# Patient Record
Sex: Female | Born: 1980 | Race: Black or African American | Hispanic: No | Marital: Single | Smoking: Never smoker
Health system: Southern US, Community
[De-identification: ages and names within clinical notes are randomized; demographics above are authoritative.]

## PROBLEM LIST (undated history)

## (undated) DIAGNOSIS — Z789 Other specified health status: Secondary | ICD-10-CM

## (undated) HISTORY — PX: NO PAST SURGERIES: SHX2092

## (undated) HISTORY — PX: WISDOM TOOTH EXTRACTION: SHX21

## (undated) HISTORY — PX: DILATION AND CURETTAGE OF UTERUS: SHX78

---
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
## 2003-02-06 ENCOUNTER — Inpatient Hospital Stay (HOSPITAL_COMMUNITY): Admission: RE | Admit: 2003-02-06 | Discharge: 2003-02-06 | Payer: Self-pay | Admitting: *Deleted

## 2003-02-06 ENCOUNTER — Encounter: Payer: Self-pay | Admitting: *Deleted

## 2003-02-07 ENCOUNTER — Ambulatory Visit (HOSPITAL_COMMUNITY): Admission: RE | Admit: 2003-02-07 | Discharge: 2003-02-07 | Payer: Self-pay | Admitting: *Deleted

## 2003-02-07 ENCOUNTER — Encounter (INDEPENDENT_AMBULATORY_CARE_PROVIDER_SITE_OTHER): Payer: Self-pay | Admitting: Specialist

## 2003-03-07 ENCOUNTER — Encounter: Admission: RE | Admit: 2003-03-07 | Discharge: 2003-03-07 | Payer: Self-pay | Admitting: Obstetrics and Gynecology

## 2003-09-17 ENCOUNTER — Emergency Department (HOSPITAL_COMMUNITY): Admission: AD | Admit: 2003-09-17 | Discharge: 2003-09-17 | Payer: Self-pay | Admitting: Internal Medicine

## 2005-09-17 ENCOUNTER — Ambulatory Visit (HOSPITAL_COMMUNITY): Admission: RE | Admit: 2005-09-17 | Discharge: 2005-09-17 | Payer: Self-pay | Admitting: *Deleted

## 2005-10-16 ENCOUNTER — Ambulatory Visit: Payer: Self-pay | Admitting: Certified Nurse Midwife

## 2005-10-16 ENCOUNTER — Inpatient Hospital Stay (HOSPITAL_COMMUNITY): Admission: AD | Admit: 2005-10-16 | Discharge: 2005-10-17 | Payer: Self-pay | Admitting: Obstetrics & Gynecology

## 2006-01-01 ENCOUNTER — Ambulatory Visit: Payer: Self-pay | Admitting: Obstetrics and Gynecology

## 2006-01-01 ENCOUNTER — Inpatient Hospital Stay (HOSPITAL_COMMUNITY): Admission: AD | Admit: 2006-01-01 | Discharge: 2006-01-01 | Payer: Self-pay | Admitting: Family Medicine

## 2006-01-05 ENCOUNTER — Ambulatory Visit: Payer: Self-pay | Admitting: Obstetrics & Gynecology

## 2006-01-09 ENCOUNTER — Inpatient Hospital Stay (HOSPITAL_COMMUNITY): Admission: AD | Admit: 2006-01-09 | Discharge: 2006-01-12 | Payer: Self-pay | Admitting: Obstetrics & Gynecology

## 2006-01-09 ENCOUNTER — Ambulatory Visit: Payer: Self-pay | Admitting: Family Medicine

## 2006-07-31 ENCOUNTER — Emergency Department (HOSPITAL_COMMUNITY): Admission: EM | Admit: 2006-07-31 | Discharge: 2006-07-31 | Payer: Self-pay | Admitting: Emergency Medicine

## 2007-06-06 ENCOUNTER — Emergency Department (HOSPITAL_COMMUNITY): Admission: EM | Admit: 2007-06-06 | Discharge: 2007-06-06 | Payer: Self-pay | Admitting: Emergency Medicine

## 2007-06-26 ENCOUNTER — Emergency Department (HOSPITAL_COMMUNITY): Admission: EM | Admit: 2007-06-26 | Discharge: 2007-06-26 | Payer: Self-pay | Admitting: Emergency Medicine

## 2010-12-20 ENCOUNTER — Emergency Department (HOSPITAL_COMMUNITY)
Admission: EM | Admit: 2010-12-20 | Discharge: 2010-12-20 | Disposition: A | Discharge status: Home / Self Care | Payer: Self-pay | Attending: Emergency Medicine | Admitting: Emergency Medicine

## 2010-12-20 DIAGNOSIS — R000 Tachycardia, unspecified: Secondary | ICD-10-CM | POA: Insufficient documentation

## 2010-12-20 DIAGNOSIS — R002 Palpitations: Secondary | ICD-10-CM | POA: Insufficient documentation

## 2011-01-03 NOTE — Discharge Summary (Signed)
NAME:  Lori Cross, Lori Cross               ACCOUNT NO.:  [id number]   MEDICAL RECORD NO.:  [id number]          PATIENT TYPE:  INP   LOCATION:  9116                          FACILITY:  [facility]   PHYSICIAN:  Ginger Carne, MD  DATE OF BIRTH:  1980/09/13   DATE OF ADMISSION:  01/09/2006  DATE OF DISCHARGE:                                 DISCHARGE SUMMARY   REASON FOR HOSPITALIZATION:  Term pregnancy in active labor.   IN-HOSPITAL PROCEDURES:  1.  Normal spontaneous vaginal delivery.  2.  Antibiotic treatment for postpartum pyelonephritis.   FINAL DIAGNOSES:  1.  Normal spontaneous vaginal delivery, term viable delivery of female      infant.  2.  Postpartum pyelonephritis   HOSPITAL COURSE:  This patient is a 30 year old gravida 3, para 2-0-1-2,  African-American female presenting at 40-5/[redacted] weeks gestation in active labor  who delivered a female infant by normal spontaneous vaginal delivery on Jan 09, 2006.  Her postpartum course was complicated by a temperature elevation  in the evening of delivery to 100.6 degrees Fahrenheit which then increased  to 103.1 degrees Fahrenheit.  The patient had been previously treated with  Macrobid for a presumptive diagnosis of urinary tract infection.  This was  approximately three to four weeks ago, and cultures demonstrated sensitivity  to all antibiotics.  The patient has had no specific symptoms suggestive of  any one particular organ site as the etiology for infection.  Specifically,  the uterus was not tender, scant flow.  She did not have CVA tenderness.  No  suggestion of upper respiratory tract infection.  Lungs were clear, and  calves were without tenderness.  Perineum site was clean and dry, and the  patient did not have a laceration.  The patient was empirically treated with  Unasyn 3.0 grams IV every 6 hours for a presumptive diagnosis of  pyelonephritis/ urinary tract infection.  The patient's temperature in the  past 24 hours prior to  discharge remained normal.   The patient is O+ and rubella immune.   The patient was thus discharged with Augmentin 875 mg twice a day in  addition to prenatal vitamins and Ortho Tri-Cyclen Lo to be taken daily.  She will return to the health department in four weeks for her postpartum  care.  The patient was advised to contact the office if her temperature is  above 100.4 degrees Fahrenheit - should check the temperature twice a day,  increasing vaginal bleeding, pelvic pain, uterine pain and/or flank pain.  All questions answered to satisfaction.      Ginger Carne, MD  Electronically Signed     SHB/MEDQ  D:  01/12/2006  T:  01/12/2006  Job:  [id number]

## 2011-01-03 NOTE — Op Note (Signed)
   NAME:  Lori Cross, FIKE                         ACCOUNT NO.:  [id number]   MEDICAL RECORD NO.:  [id number]                   PATIENT TYPE:  AMB   LOCATION:  SDC                                  [facility]ACILITY:  [facility]   PHYSICIAN:  Clement Husbands, M.D.         DATE OF BIRTH:  1981-04-10   DATE OF PROCEDURE:  02/07/2003  DATE OF DISCHARGE:                                 OPERATIVE REPORT   PREOPERATIVE DIAGNOSIS:  Missed abortion.   POSTOPERATIVE DIAGNOSIS:  Missed abortion.   OPERATION:  Suction dilatation and curettage.   SURGEON:  Burnadette Peter, M.D.   ANESTHESIA:  General anesthesia.   DESCRIPTION OF PROCEDURE:  With the patient under satisfactory general  anesthesia in the dorsal lithotomy position, the perineum and vagina were  prepped and draped.  Anterior weighted speculum was placed in the vagina.  The uterus was about 10 weeks' size.  The anterior lip of the cervix was  grasped with single-tooth tenaculum.  The cervical canal was dilated  sufficiently to accept a #8 and a #9 curved suction curette.  A #8 suction  curette was inserted and some placental white tissue was removed.  Sharp  curettage was then done which revealed that the entire anterior uterine fall  felt very smooth and not the typical gritty feeling.  Expiration with  polyp forceps was then done and some larger pieces of tissue were removed  plus some clear fluid came forth.  Using the #9 curved suction curetted, the  endometrial cavity was then recuretted yielding a fair amount of obvious  tissues of pregnancy.  Intravenous Pitocin drip was running.  The uterus was  firming up really well.  After this, sharp curettage was again done and the  entire uterine cavity was felt to be normal.  Blood loss was minimal.  Sponge count correct.  The patient tolerated the procedure well and returned  to the recovery room in satisfactory condition.                                               Clement Husbands, M.D.    EFR/MEDQ  D:  02/07/2003  T:  02/08/2003  Job:  [id number]

## 2011-01-03 NOTE — H&P (Signed)
   NAME:  Lori Cross, Lori Cross                         ACCOUNT NO.:  [id number]   MEDICAL RECORD NO.:  [id number]                   PATIENT TYPE:  MAT   LOCATION:  MATC                                 [facility]ILITY:  [facility]   PHYSICIAN:  Clement Husbands, M.D.         DATE OF BIRTH:  14-Oct-1980   DATE OF ADMISSION:  02/06/2003  DATE OF DISCHARGE:                                HISTORY & PHYSICAL   HISTORY OF PRESENT ILLNESS:  This 30 year old black female gravida 2, para  1, last menstrual period November 10, 2002, has been followed at [facility]. Today, her 2nd visit, there were no fetal heart tones noted. She was  referred over here to [facility]  for an ultrasound. The ultrasound  failed to reveal a yolk sac or fetal heart rate.   PAST MEDICAL HISTORY:  No operations. She was treated with 2 weeks ago for  Chlamydia.   ALLERGIES:  None.   MEDICATIONS:  Prenatal vitamins.   LABORATORY DATA:  Blood type O positive, negative antibodies. Serology  nonreactive. Hepatitis B negative. Rubella immune. She has sickle cell  trait. Gonorrhea negative, Chlamydia as mentioned above was positive. HIV  antibody negative. Pap smear negative.   SOCIAL HISTORY:  Noncontributory.   PHYSICAL EXAMINATION:  VITAL SIGNS:  Blood pressure normal.  CHEST:  Clear to auscultation and percussion.  HEART:  Regular sinus rhythm without murmurs or enlargement.  BREASTS:  Normal.  ABDOMEN:  Soft, nontender. The uterus was not palpable.  PELVIS:  The external genitalia and BUS glands were normal. The vaginal  vault was able to be visualized as was the cervix. The uterus is thought to  be about 8 to 10 weeks size, firm and somewhat posterior. Adnexal structures  were normal.   IMPRESSION:  Missed abortion.   PLAN:  Suction dilatation and curretage tomorrow.                                                 Clement Husbands, M.D.    EFR/MEDQ  D:  02/06/2003  T:  02/06/2003  Job:  [id number]

## 2011-02-16 ENCOUNTER — Emergency Department (HOSPITAL_COMMUNITY)
Admission: EM | Admit: 2011-02-16 | Discharge: 2011-02-16 | Disposition: A | Discharge status: Home / Self Care | Payer: Medicaid Other | Attending: Emergency Medicine | Admitting: Emergency Medicine

## 2011-02-16 DIAGNOSIS — R1310 Dysphagia, unspecified: Secondary | ICD-10-CM | POA: Insufficient documentation

## 2011-02-16 DIAGNOSIS — J029 Acute pharyngitis, unspecified: Secondary | ICD-10-CM | POA: Insufficient documentation

## 2011-02-16 DIAGNOSIS — R509 Fever, unspecified: Secondary | ICD-10-CM | POA: Insufficient documentation

## 2011-02-16 LAB — RAPID STREP SCREEN (MED CTR MEBANE ONLY): Streptococcus, Group A Screen (Direct): NEGATIVE

## 2011-07-20 ENCOUNTER — Emergency Department (HOSPITAL_COMMUNITY)
Admission: EM | Admit: 2011-07-20 | Discharge: 2011-07-20 | Disposition: A | Discharge status: Home / Self Care | Payer: Medicaid Other | Attending: Emergency Medicine | Admitting: Emergency Medicine

## 2011-07-20 ENCOUNTER — Encounter: Payer: Self-pay | Admitting: Emergency Medicine

## 2011-07-20 DIAGNOSIS — H5789 Other specified disorders of eye and adnexa: Secondary | ICD-10-CM | POA: Insufficient documentation

## 2011-07-20 DIAGNOSIS — H5710 Ocular pain, unspecified eye: Secondary | ICD-10-CM | POA: Insufficient documentation

## 2011-07-20 DIAGNOSIS — H109 Unspecified conjunctivitis: Secondary | ICD-10-CM

## 2011-07-20 DIAGNOSIS — H11419 Vascular abnormalities of conjunctiva, unspecified eye: Secondary | ICD-10-CM | POA: Insufficient documentation

## 2011-07-20 MED ORDER — ERYTHROMYCIN 5 MG/GM OP OINT
1.0000 "application " | TOPICAL_OINTMENT | Freq: Once | OPHTHALMIC | Status: AC
  Administered 2011-07-20: 1 via OPHTHALMIC
  Filled 2011-07-20: qty 1

## 2011-07-20 MED ORDER — FLUORESCEIN SODIUM 1 MG OP STRP
1.0000 | ORAL_STRIP | Freq: Once | OPHTHALMIC | Status: AC
  Administered 2011-07-20: 1 via OPHTHALMIC
  Filled 2011-07-20 (×2): qty 1

## 2011-07-20 MED ORDER — ERYTHROMYCIN 5 MG/GM OP OINT: TOPICAL_OINTMENT | OPHTHALMIC | Status: AC

## 2011-07-20 MED ORDER — TETRACAINE HCL 0.5 % OP SOLN
2.0000 [drp] | Freq: Once | OPHTHALMIC | Status: AC
  Administered 2011-07-20: 2 [drp] via OPHTHALMIC
  Filled 2011-07-20 (×2): qty 2

## 2011-07-20 NOTE — ED Provider Notes (Signed)
History     CSN: [id number] Arrival date & time: 07/20/2011  5:21 PM   First MD Initiated Contact with Patient 07/20/11 1918      Chief Complaint  Patient presents with  . Eye Problem    (Consider location/radiation/quality/duration/timing/severity/associated sxs/prior treatment) Patient is a 30 y.o. female presenting with eye problem. The history is provided by the patient.  Eye Problem  This is a new problem. The current episode started more than 1 week ago. The problem occurs constantly. The problem has not changed since onset.There is pain in the left eye. There was no injury mechanism. The patient is experiencing no pain. There is no history of trauma to the eye. There is no known exposure to pink eye. She does not wear contacts. Associated symptoms include discharge (mild, overnight), foreign body sensation and eye redness. Pertinent negatives include no blurred vision, no decreased vision, no photophobia, no nausea, no vomiting, no tingling, no weakness and no itching. She has tried water for the symptoms. The treatment provided no relief.    History reviewed. No pertinent past medical history.  History reviewed. No pertinent past surgical history.  No family history on file.  History  Substance Use Topics  . Smoking status: Never Smoker   . Smokeless tobacco: Not on file  . Alcohol Use: Yes    OB History    Grav Para Term Preterm Abortions TAB SAB Ect Mult Living                  Review of Systems  Constitutional: Negative for fever and chills.  HENT: Negative for ear pain, sore throat, facial swelling and tinnitus.   Eyes: Positive for discharge (mild, overnight) and redness. Negative for blurred vision, photophobia, pain and visual disturbance.  Respiratory: Negative for cough and shortness of breath.   Cardiovascular: Negative for chest pain and palpitations.  Gastrointestinal: Negative for nausea, vomiting and abdominal pain.  Musculoskeletal: Negative for  back pain.  Skin: Negative for color change, itching and rash.  Neurological: Negative for tingling, weakness, light-headedness and headaches.  All other systems reviewed and are negative.    Allergies  Review of patient's allergies indicates no known allergies.  Home Medications  No current outpatient prescriptions on file.  BP 128/77  Pulse 98  Temp(Src) 98.1 F (36.7 C) (Oral)  Resp 20  SpO2 99%  Physical Exam  Nursing note and vitals reviewed. Constitutional: She is oriented to person, place, and time. She appears well-developed and well-nourished.  HENT:  Head: Normocephalic and atraumatic.  Eyes: Pupils are equal, round, and reactive to light. No foreign bodies found. Left eye exhibits discharge (mild, yellow). Left eye exhibits no exudate. No foreign body present in the left eye. Left conjunctiva is injected. Left eye exhibits normal extraocular motion.  Slit lamp exam:      The left eye shows no corneal abrasion, no corneal ulcer, no foreign body, no hyphema and no fluorescein uptake.  Cardiovascular: Normal rate, regular rhythm, normal heart sounds and intact distal pulses.   Pulmonary/Chest: Effort normal and breath sounds normal. No respiratory distress.  Abdominal: Soft. She exhibits no distension. There is no tenderness.  Neurological: She is alert and oriented to person, place, and time.  Skin: Skin is warm and dry.  Psychiatric: She has a normal mood and affect.    ED Course  Procedures (including critical care time)  Labs Reviewed - No data to display No results found.   1. Conjunctivitis  MDM  30 year old female presents with a week's worth of foreign body sensation in her left eye, and onset of redness as of today. States initially had some redness of the eye with onset of the foreign body sensation however has resolved over the course of the past week. Denies any photophobia, changes in vision, pain or irritation, no facial pain or swelling, no  fevers or other signs of infection. Does have occasional small amount of discharge overnight. Has tried rinsing her eye with water without any success. He does not wear contacts and denies any trauma to the left side. No signs of any facial swelling, erythema, or tenderness to palpation. Eye exam is remarkable for mild injection of the left conjunctiva with a small amount of yellow discharge, pupils are equal and round, reactive to light, no foreign bodies are seen, there is no sign of corneal abrasion or abnormal fluorescein uptake. Discussed with patient that current symptoms are likely due to infectious etiology, there was possibly an initial underlying foreign body present causing irritation leading to recent symptoms, however no foreign bodies are currently seen. Discussed with the patient's treatment, as well as followup if symptoms persist, and changing of symptoms necessitating return to the ED. Patient expresses understanding with this plan.        Theotis Burrow, MD 07/20/11 [phone]

## 2011-07-20 NOTE — ED Notes (Signed)
Resident at bedside.  

## 2011-07-20 NOTE — ED Notes (Signed)
Patient complaining of left eye irritation for about a week.  Patient states that it began with a feeling of something being in her eye, and has progressively gotten worse over the course of the week.  Patient states that the redness in her left eye started a few days ago and she felt that she needed to have it checked out.  Patient reports itchiness, redness, and irritation in the left eye.  Patient alert and oriented x4; PERRL present.  Family present at bedside.  Will continue to monitor.

## 2011-07-20 NOTE — ED Notes (Signed)
Patient given copy of discharge paperwork.  Went over discharge instructions with patient; instructed patient to apply eye ointment three times a day for 1 week and to follow up with an eye physician if symptoms persist more than three days.  Patient instructed to return to the ED for new, worsening, or concerning symptoms.

## 2011-07-20 NOTE — ED Notes (Signed)
L eye irritation x 1 week. States it just started turning red.  Denies pain.

## 2011-07-22 NOTE — ED Provider Notes (Signed)
I saw and evaluated the patient, reviewed the resident's note and I agree with the findings and plan.   Les Longmore A. Patrica Duel, MD 07/22/11 1042

## 2011-11-26 ENCOUNTER — Encounter (HOSPITAL_COMMUNITY): Payer: Self-pay | Admitting: *Deleted

## 2011-11-26 ENCOUNTER — Emergency Department (HOSPITAL_COMMUNITY)
Admission: EM | Admit: 2011-11-26 | Discharge: 2011-11-26 | Disposition: A | Discharge status: Home / Self Care | Payer: Medicaid Other | Attending: Emergency Medicine | Admitting: Emergency Medicine

## 2011-11-26 DIAGNOSIS — H9209 Otalgia, unspecified ear: Secondary | ICD-10-CM | POA: Insufficient documentation

## 2011-11-26 DIAGNOSIS — H9201 Otalgia, right ear: Secondary | ICD-10-CM

## 2011-11-26 MED ORDER — ANTIPYRINE-BENZOCAINE 5.4-1.4 % OT SOLN
3.0000 [drp] | Freq: Once | OTIC | Status: AC
  Administered 2011-11-26: 4 [drp] via OTIC
  Filled 2011-11-26: qty 10

## 2011-11-26 MED ORDER — CETIRIZINE-PSEUDOEPHEDRINE ER 5-120 MG PO TB12: 1.0000 | ORAL_TABLET | Freq: Every day | ORAL | Status: DC

## 2011-11-26 MED ORDER — IBUPROFEN 600 MG PO TABS: 600.0000 mg | ORAL_TABLET | Freq: Four times a day (QID) | ORAL | Status: AC | PRN

## 2011-11-26 NOTE — ED Notes (Signed)
Reports having right ear pain x 2 days.

## 2011-11-26 NOTE — Discharge Instructions (Signed)
Your ear does not appear to be infected. Apply auralgan ear drops, 2-4 drops every 2-4 hrs. Take ibuprofen for pain. Take zyrtec D for congestion as prescribed. Follow up with your primary care doctor if not improving.  Otalgia The most common reason for this in children is an infection of the middle ear. Pain from the middle ear is usually caused by a build-up of fluid and pressure behind the eardrum. Pain from an earache can be sharp, dull, or burning. The pain may be temporary or constant. The middle ear is connected to the nasal passages by a short narrow tube called the Eustachian tube. The Eustachian tube allows fluid to drain out of the middle ear, and helps keep the pressure in your ear equalized. CAUSES  A cold or allergy can block the Eustachian tube with inflammation and the build-up of secretions. This is especially likely in small children, because their Eustachian tube is shorter and more horizontal. When the Eustachian tube closes, the normal flow of fluid from the middle ear is stopped. Fluid can accumulate and cause stuffiness, pain, hearing loss, and an ear infection if germs start growing in this area. SYMPTOMS  The symptoms of an ear infection may include fever, ear pain, fussiness, increased crying, and irritability. Many children will have temporary and minor hearing loss during and right after an ear infection. Permanent hearing loss is rare, but the risk increases the more infections a child has. Other causes of ear pain include retained water in the outer ear canal from swimming and bathing. Ear pain in adults is less likely to be from an ear infection. Ear pain may be referred from other locations. Referred pain may be from the joint between your jaw and the skull. It may also come from a tooth problem or problems in the neck. Other causes of ear pain include:  A foreign body in the ear.   Outer ear infection.   Sinus infections.   Impacted ear wax.   Ear injury.    Arthritis of the jaw or TMJ problems.   Middle ear infection.   Tooth infections.   Sore throat with pain to the ears.  DIAGNOSIS  Your caregiver can usually make the diagnosis by examining you. Sometimes other special studies, including x-rays and lab work may be necessary. TREATMENT   If antibiotics were prescribed, use them as directed and finish them even if you or your child's symptoms seem to be improved.   Sometimes PE tubes are needed in children. These are little plastic tubes which are put into the eardrum during a simple surgical procedure. They allow fluid to drain easier and allow the pressure in the middle ear to equalize. This helps relieve the ear pain caused by pressure changes.  HOME CARE INSTRUCTIONS   Only take over-the-counter or prescription medicines for pain, discomfort, or fever as directed by your caregiver. DO NOT GIVE CHILDREN ASPIRIN because of the association of Reye's Syndrome in children taking aspirin.   Use a cold pack applied to the outer ear for 15 to 20 minutes, 3 to 4 times per day or as needed may reduce pain. Do not apply ice directly to the skin. You may cause frost bite.   Over-the-counter ear drops used as directed may be effective. Your caregiver may sometimes prescribe ear drops.   Resting in an upright position may help reduce pressure in the middle ear and relieve pain.   Ear pain caused by rapidly descending from high altitudes can be  relieved by swallowing or chewing gum. Allowing infants to suck on a bottle during airplane travel can help.   Do not smoke in the house or near children. If you are unable to quit smoking, smoke outside.   Control allergies.  SEEK IMMEDIATE MEDICAL CARE IF:   You or your child are becoming sicker.   Pain or fever relief is not obtained with medicine.   You or your child's symptoms (pain, fever, or irritability) do not improve within 24 to 48 hours or as instructed.   Severe pain suddenly stops  hurting. This may indicate a ruptured eardrum.   You or your children develop new problems such as severe headaches, stiff neck, difficulty swallowing, or swelling of the face or around the ear.  Document Released: 03/21/2004 Document Revised: 07/24/2011 Document Reviewed: 07/26/2008 [facility] Patient Information 2012 [place], [place].

## 2011-11-26 NOTE — ED Provider Notes (Signed)
History     CSN: [id number]  Arrival date & time 11/26/11  4098   First MD Initiated Contact with Patient 11/26/11 [phone]      Chief Complaint  Patient presents with  . Otalgia    (Consider location/radiation/quality/duration/timing/severity/associated sxs/prior treatment) Patient is a 31 y.o. female presenting with ear pain. The history is provided by the patient.  Otalgia This is a new problem. The current episode started yesterday. There is pain in the right ear. The problem occurs constantly. The problem has not changed since onset.There has been no fever. Associated symptoms include rhinorrhea. Pertinent negatives include no ear discharge, no headaches, no hearing loss, no sore throat and no rash.  States started having right ear pain yesterday. Reports pain is constant, not worsened with palpation or swallowing. Admits to nasal congestion. Denies sore throat. Did not take any medications. Denies hearing changes, no headache, no drainage.  History reviewed. No pertinent past medical history.  History reviewed. No pertinent past surgical history.  History reviewed. No pertinent family history.  History  Substance Use Topics  . Smoking status: Never Smoker   . Smokeless tobacco: Not on file  . Alcohol Use: Yes    OB History    Grav Para Term Preterm Abortions TAB SAB Ect Mult Living                  Review of Systems  Constitutional: Negative for fever and chills.  HENT: Positive for ear pain and rhinorrhea. Negative for hearing loss, sore throat and ear discharge.   Eyes: Negative.   Respiratory: Negative.   Cardiovascular: Negative.   Gastrointestinal: Negative.   Skin: Negative.  Negative for rash.  Neurological: Negative for dizziness, weakness and headaches.  Psychiatric/Behavioral: Negative.     Allergies  Review of patient's allergies indicates no known allergies.  Home Medications   Current Outpatient Rx  Name Route Sig Dispense Refill  . SODIUM  CHLORIDE 0.65 % NA SOLN Nasal Place 1 spray into the nose as needed. For stuffy nose      BP 131/77  Pulse 92  Temp(Src) 99.1 F (37.3 C) (Oral)  Resp 18  SpO2 100%  Physical Exam  Nursing note and vitals reviewed. Constitutional: She appears well-developed and well-nourished. No distress.  HENT:  Head: Normocephalic and atraumatic.  Right Ear: Tympanic membrane, external ear and ear canal normal. No drainage, swelling or tenderness. No decreased hearing is noted.  Left Ear: Tympanic membrane, external ear and ear canal normal. No drainage, swelling or tenderness. No decreased hearing is noted.  Nose: Rhinorrhea present.  Mouth/Throat: Uvula is midline, oropharynx is clear and moist and mucous membranes are normal. No dental abscesses or uvula swelling. No oropharyngeal exudate, posterior oropharyngeal edema or posterior oropharyngeal erythema.  Eyes: Conjunctivae are normal.  Neck: Neck supple.  Cardiovascular: Normal rate, regular rhythm and normal heart sounds.   Pulmonary/Chest: Effort normal and breath sounds normal. No respiratory distress.  Neurological: She is alert.  Skin: Skin is warm and dry.  Psychiatric: She has a normal mood and affect.    ED Course  Procedures (including critical care time)  No signs of infection on exam. No bad teeth, throat is normal. Pt in NAD, vitals normal. Applied auralgan ear drops, which helped her. Will d/c home.    1. Right ear pain       MDM          Lottie Mussel, PA 11/26/11 1542

## 2011-11-27 NOTE — ED Provider Notes (Signed)
Medical screening examination/treatment/procedure(s) were performed by non-physician practitioner and as supervising physician I was immediately available for consultation/collaboration.  Tyreece Gelles, MD 11/27/11 1327 

## 2011-12-14 ENCOUNTER — Emergency Department (HOSPITAL_COMMUNITY)
Admission: EM | Admit: 2011-12-14 | Discharge: 2011-12-14 | Disposition: A | Discharge status: Home / Self Care | Payer: Medicaid Other | Attending: Emergency Medicine | Admitting: Emergency Medicine

## 2011-12-14 ENCOUNTER — Encounter (HOSPITAL_COMMUNITY): Payer: Self-pay | Admitting: Emergency Medicine

## 2011-12-14 DIAGNOSIS — R6883 Chills (without fever): Secondary | ICD-10-CM | POA: Insufficient documentation

## 2011-12-14 DIAGNOSIS — B9789 Other viral agents as the cause of diseases classified elsewhere: Secondary | ICD-10-CM | POA: Insufficient documentation

## 2011-12-14 DIAGNOSIS — M791 Myalgia, unspecified site: Secondary | ICD-10-CM

## 2011-12-14 DIAGNOSIS — B349 Viral infection, unspecified: Secondary | ICD-10-CM

## 2011-12-14 DIAGNOSIS — IMO0001 Reserved for inherently not codable concepts without codable children: Secondary | ICD-10-CM | POA: Insufficient documentation

## 2011-12-14 LAB — URINE MICROSCOPIC-ADD ON

## 2011-12-14 LAB — URINALYSIS, ROUTINE W REFLEX MICROSCOPIC
Bilirubin Urine: NEGATIVE
Nitrite: NEGATIVE
Specific Gravity, Urine: 1.016 (ref 1.005–1.030)
Urobilinogen, UA: 1 mg/dL (ref 0.0–1.0)

## 2011-12-14 MED ORDER — IBUPROFEN 200 MG PO TABS
600.0000 mg | ORAL_TABLET | Freq: Once | ORAL | Status: AC
  Administered 2011-12-14: 600 mg via ORAL
  Filled 2011-12-14: qty 3

## 2011-12-14 NOTE — ED Provider Notes (Signed)
History     CSN: [id number]  Arrival date & time 12/14/11  1451   None     Chief Complaint  Patient presents with  . Chills  . Generalized Body Aches    (Consider location/radiation/quality/duration/timing/severity/associated sxs/prior treatment) The history is provided by the patient.  pt c/o sujbective fever, body aches for past 2-3 days. States recently txd for uti, denies dysuria, abd or flank pain. No frequency or urgency. Denies any abd or pelvic pain. No nvd. Denies cough, sore throat, headache, nasal congestion or other uri c/o. No sob. No cp. No focal muscle or joint pain or swelling - states achy all over. No rash. No known ill contacts. No sweats.   History reviewed. No pertinent past medical history.  History reviewed. No pertinent past surgical history.  History reviewed. No pertinent family history.  History  Substance Use Topics  . Smoking status: Never Smoker   . Smokeless tobacco: Not on file  . Alcohol Use: Yes    OB History    Grav Para Term Preterm Abortions TAB SAB Ect Mult Living                  Review of Systems  Constitutional: Positive for chills.  HENT: Negative for congestion, sore throat, rhinorrhea and neck pain.   Eyes: Negative for redness.  Respiratory: Negative for shortness of breath.   Cardiovascular: Negative for chest pain.  Gastrointestinal: Negative for vomiting, abdominal pain and diarrhea.  Genitourinary: Negative for flank pain.  Musculoskeletal: Negative for back pain.  Skin: Negative for rash and wound.  Neurological: Negative for weakness, numbness and headaches.  Hematological: Does not bruise/bleed easily.  Psychiatric/Behavioral: Negative for confusion.    Allergies  Review of patient's allergies indicates no known allergies.  Home Medications   Current Outpatient Rx  Name Route Sig Dispense Refill  . FLUCONAZOLE 150 MG PO TABS Oral Take 150 mg by mouth once.      BP 129/72  Pulse 122  Temp(Src) 98.9 F  (37.2 C) (Oral)  Resp 20  SpO2 100%  Physical Exam  Nursing note and vitals reviewed. Constitutional: She is oriented to person, place, and time. She appears well-developed and well-nourished. No distress.  HENT:  Right Ear: External ear normal.  Left Ear: External ear normal.  Nose: Nose normal.  Mouth/Throat: Oropharynx is clear and moist.  Eyes: Conjunctivae are normal. No scleral icterus.  Neck: Neck supple. No tracheal deviation present. No thyromegaly present.       No stiffness or rigidity  Cardiovascular: Normal rate, regular rhythm, normal heart sounds and intact distal pulses.  Exam reveals no gallop and no friction rub.   No murmur heard. Pulmonary/Chest: Effort normal and breath sounds normal. No respiratory distress.  Abdominal: Soft. Normal appearance and bowel sounds are normal. She exhibits no distension and no mass. There is no tenderness. There is no rebound and no guarding.  Genitourinary:       No cva tenderness  Musculoskeletal: She exhibits no edema and no tenderness.       Good rom bil ext. No focal sts or focal pain/tenderness  Neurological: She is alert and oriented to person, place, and time.  Skin: Skin is warm and dry. No rash noted.       No rash, no petechia.   Psychiatric: She has a normal mood and affect.    ED Course  Procedures (including critical care time)  Results for orders placed during the hospital encounter of 12/14/11  URINALYSIS, ROUTINE W REFLEX MICROSCOPIC      Component Value Range   Color, Urine YELLOW  YELLOW    APPearance CLEAR  CLEAR    Specific Gravity, Urine 1.016  1.005 - 1.030    pH 6.0  5.0 - 8.0    Glucose, UA NEGATIVE  NEGATIVE (mg/dL)   Hgb urine dipstick LARGE (*) NEGATIVE    Bilirubin Urine NEGATIVE  NEGATIVE    Ketones, ur NEGATIVE  NEGATIVE (mg/dL)   Protein, ur NEGATIVE  NEGATIVE (mg/dL)   Urobilinogen, UA 1.0  0.0 - 1.0 (mg/dL)   Nitrite NEGATIVE  NEGATIVE    Leukocytes, UA NEGATIVE  NEGATIVE   PREGNANCY,  URINE      Component Value Range   Preg Test, Ur NEGATIVE  NEGATIVE   URINE MICROSCOPIC-ADD ON      Component Value Range   Squamous Epithelial / LPF RARE  RARE    RBC / HPF 7-10  <3 (RBC/hpf)      MDM  Ua.    Motrin po for body aches.  Low grade t 99.6 in ed.   Pt denies any new/focal/localizing symptoms. No headache. No cough, sinus c/o or uri symptoms. Denies any sob. Denies any abd pain. No nvd. abd soft nt. No extremity pain or swelling. No cellulitis. No rash.   Given current symptoms, exam, feel most likely viral illness. Discussed w pt, recheck pcp or here in next couple days if symptoms fail to improve/resolve (sooner if worse, localizing symptoms, new symptoms, other concern).      Suzi Roots, MD 12/14/11 [phone]

## 2011-12-14 NOTE — ED Notes (Signed)
Pt reports chills and body aches onset Thursday. No other household members ill at present.

## 2011-12-14 NOTE — Discharge Instructions (Signed)
Rest. Drink plenty of fluids. Take tylenol/motrin as need.  Follow up with primary care doctor, or urgent care/here, in the next couple days for recheck if symptoms fail to improve/resolve. Return sooner if worse, new or localizing symptoms, cough/trouble breathing, abdominal pain, vomiting, weak/faint, other concern.      Myalgia, Adult Myalgia is the medical term for muscle pain. It is a symptom of many things. Nearly everyone at some time in their life has this. The most common cause for muscle pain is overuse or straining and more so when you are not in shape. Injuries and muscle bruises cause myalgias. Muscle pain without a history of injury can also be caused by a virus. It frequently comes along with the flu. Myalgia not caused by muscle strain can be present in a large number of infectious diseases. Some autoimmune diseases like lupus and fibromyalgia can cause muscle pain. Myalgia may be mild, or severe. SYMPTOMS  The symptoms of myalgia are simply muscle pain. Most of the time this is short lived and the pain goes away without treatment. DIAGNOSIS  Myalgia is diagnosed by your caregiver by taking your history. This means you tell him when the problems began, what they are, and what has been happening. If this has not been a long term problem, your caregiver may want to watch for a while to see what will happen. If it has been long term, they may want to do additional testing. TREATMENT  The treatment depends on what the underlying cause of the muscle pain is. Often anti-inflammatory medications will help. HOME CARE INSTRUCTIONS  If the pain in your muscles came from overuse, slow down your activities until the problems go away.   Myalgia from overuse of a muscle can be treated with alternating hot and cold packs on the muscle affected or with cold for the first couple days. If either heat or cold seems to make things worse, stop their use.   Apply ice to the sore area for 15 to 20  minutes, 3 to 4 times per day, while awake for the first 2 days of muscle soreness, or as directed. Put the ice in a plastic bag and place a towel between the bag of ice and your skin.   Only take over-the-counter or prescription medicines for pain, discomfort, or fever as directed by your caregiver.   Regular gentle exercise may help if you are not active.   Stretching before strenuous exercise can help lower the risk of myalgia. It is normal when beginning an exercise regimen to feel some muscle pain after exercising. Muscles that have not been used frequently will be sore at first. If the pain is extreme, this may mean injury to a muscle.  SEEK MEDICAL CARE IF:  You have an increase in muscle pain that is not relieved with medication.   You begin to run a temperature.   You develop nausea and vomiting.   You develop a stiff and painful neck.   You develop a rash.   You develop muscle pain after a tick bite.   You have continued muscle pain while working out even after you are in good condition.  SEEK IMMEDIATE MEDICAL CARE IF: Any of your problems are getting worse and medications are not helping. MAKE SURE YOU:   Understand these instructions.   Will watch your condition.   Will get help right away if you are not doing well or get worse.  Document Released: 06/26/2006 Document Revised: 07/24/2011 Document Reviewed:  09/15/2006 Exit[employer] Patient Information 2012 [place], [place].

## 2012-05-06 ENCOUNTER — Emergency Department (HOSPITAL_COMMUNITY)
Admission: EM | Admit: 2012-05-06 | Discharge: 2012-05-06 | Disposition: A | Discharge status: Home / Self Care | Payer: Medicaid Other | Attending: Emergency Medicine | Admitting: Emergency Medicine

## 2012-05-06 ENCOUNTER — Encounter (HOSPITAL_COMMUNITY): Payer: Self-pay | Admitting: Emergency Medicine

## 2012-05-06 DIAGNOSIS — J069 Acute upper respiratory infection, unspecified: Secondary | ICD-10-CM

## 2012-05-06 MED ORDER — GUAIFENESIN 100 MG/5ML PO SYRP: 100.0000 mg | ORAL_SOLUTION | ORAL | Status: DC | PRN

## 2012-05-06 MED ORDER — ACETAMINOPHEN 500 MG PO TABS: 500.0000 mg | ORAL_TABLET | Freq: Four times a day (QID) | ORAL | Status: DC | PRN

## 2012-05-06 NOTE — ED Notes (Signed)
Pt states pain in her sinuses began yesterday while she was at work around 1500. "It made my eyelids heavy." Pain 4/10 in sinus area. +coughing, sneezing, headache. Denies nasal discharge, fever. Pt states "scratchty" throat with swallowing two days ago.

## 2012-05-06 NOTE — ED Provider Notes (Signed)
History     CSN: [id number]  Arrival date & time 05/06/12  0744   First MD Initiated Contact with Patient 05/06/12 [phone]      Chief Complaint  Patient presents with  . Sinus Problem    (Consider location/radiation/quality/duration/timing/severity/associated sxs/prior treatment) HPI  31 year-old female presents complaining of cold symptoms.  For the past 2 days pt has had headache, runny nose, nasal congestion, eye discomfort, sore throat, nonproductive cough and sinus pain.  Denies fever, rash, abd pain, cp or sob.  Has not tried anything to alleviate her sxs.  Not a smoker, no change in environment.    No past medical history on file.  No past surgical history on file.  No family history on file.  History  Substance Use Topics  . Smoking status: Never Smoker   . Smokeless tobacco: Not on file  . Alcohol Use: Yes    OB History    Grav Para Term Preterm Abortions TAB SAB Ect Mult Living                  Review of Systems  All other systems reviewed and are negative.    Allergies  Review of patient's allergies indicates no known allergies.  Home Medications   Current Outpatient Rx  Name Route Sig Dispense Refill  . FLUCONAZOLE 150 MG PO TABS Oral Take 150 mg by mouth once.      There were no vitals taken for this visit.  Physical Exam  Nursing note and vitals reviewed. Constitutional: She is oriented to person, place, and time. She appears well-developed and well-nourished. No distress.  HENT:  Head: Normocephalic and atraumatic.  Right Ear: External ear normal.  Left Ear: External ear normal.  Mouth/Throat: Oropharynx is clear and moist. No oropharyngeal exudate.       Rhinorrhea.  Mild tenderness overlying maxillary sinus to palpation  Eyes: Conjunctivae normal and EOM are normal. Pupils are equal, round, and reactive to light.  Neck: Neck supple.  Pulmonary/Chest: Effort normal. No respiratory distress. She has no wheezes.  Abdominal: Soft. There is  no tenderness.  Lymphadenopathy:    She has no cervical adenopathy.  Neurological: She is alert and oriented to person, place, and time.  Skin: Skin is warm. No rash noted.  Psychiatric: She has a normal mood and affect.    ED Course  Procedures (including critical care time)  Labs Reviewed - No data to display No results found.   No diagnosis found.  1. URI  MDM  Pt with sxs suggestive of URI.  Will treats with guaifenesin and acetaminophen.  No other concerning signs.  Is afebrile with stable normal vital sign.    BP 118/77  Pulse 92  Temp 99 F (37.2 C) (Oral)  Resp 21  SpO2 100%  Nursing notes reviewed and considered in documentation  Previous records reviewed and considered           Fayrene Helper, PA-C 05/06/12 [phone]

## 2012-05-10 NOTE — ED Provider Notes (Signed)
Medical screening examination/treatment/procedure(s) were performed by non-physician practitioner and as supervising physician I was immediately available for consultation/collaboration.   Elie Leppo E Shaft Corigliano, MD 05/10/12 0844 

## 2012-11-08 ENCOUNTER — Emergency Department (HOSPITAL_COMMUNITY)
Admission: EM | Admit: 2012-11-08 | Discharge: 2012-11-08 | Disposition: A | Discharge status: Home / Self Care | Payer: Self-pay | Attending: Emergency Medicine | Admitting: Emergency Medicine

## 2012-11-08 ENCOUNTER — Encounter (HOSPITAL_COMMUNITY): Payer: Self-pay | Admitting: Emergency Medicine

## 2012-11-08 DIAGNOSIS — R3915 Urgency of urination: Secondary | ICD-10-CM | POA: Insufficient documentation

## 2012-11-08 DIAGNOSIS — N390 Urinary tract infection, site not specified: Secondary | ICD-10-CM | POA: Insufficient documentation

## 2012-11-08 DIAGNOSIS — Z3202 Encounter for pregnancy test, result negative: Secondary | ICD-10-CM | POA: Insufficient documentation

## 2012-11-08 DIAGNOSIS — Z79899 Other long term (current) drug therapy: Secondary | ICD-10-CM | POA: Insufficient documentation

## 2012-11-08 DIAGNOSIS — N898 Other specified noninflammatory disorders of vagina: Secondary | ICD-10-CM | POA: Insufficient documentation

## 2012-11-08 DIAGNOSIS — A599 Trichomoniasis, unspecified: Secondary | ICD-10-CM | POA: Insufficient documentation

## 2012-11-08 DIAGNOSIS — R350 Frequency of micturition: Secondary | ICD-10-CM | POA: Insufficient documentation

## 2012-11-08 LAB — URINE MICROSCOPIC-ADD ON

## 2012-11-08 LAB — URINALYSIS, ROUTINE W REFLEX MICROSCOPIC
Bilirubin Urine: NEGATIVE
Glucose, UA: NEGATIVE mg/dL
Specific Gravity, Urine: 1.012 (ref 1.005–1.030)
Urobilinogen, UA: 0.2 mg/dL (ref 0.0–1.0)

## 2012-11-08 LAB — WET PREP, GENITAL: Clue Cells Wet Prep HPF POC: NONE SEEN

## 2012-11-08 MED ORDER — METRONIDAZOLE 500 MG PO TABS: 500.0000 mg | ORAL_TABLET | Freq: Two times a day (BID) | ORAL | Status: DC

## 2012-11-08 MED ORDER — PHENAZOPYRIDINE HCL 100 MG PO TABS: 100.0000 mg | ORAL_TABLET | Freq: Three times a day (TID) | ORAL | Status: DC | PRN

## 2012-11-08 MED ORDER — NITROFURANTOIN MONOHYD MACRO 100 MG PO CAPS: 100.0000 mg | ORAL_CAPSULE | Freq: Two times a day (BID) | ORAL | Status: DC

## 2012-11-08 NOTE — ED Notes (Signed)
Pt sts UTI sx x 3 days with burning; pt denies vaginal discharge

## 2012-11-08 NOTE — ED Provider Notes (Signed)
History     CSN: [id number]  Arrival date & time 11/08/12  4098   First MD Initiated Contact with Patient 11/08/12 [phone]      Chief Complaint  Patient presents with  . Urinary Tract Infection    (Consider location/radiation/quality/duration/timing/severity/associated sxs/prior treatment) HPI Comments: Patient presents with a chief complaint of dysuria, increased urinary frequency, and urgency.  Symptoms have been present for the past 3 days and are gradually worsening.  She has not taken anything for symptoms.  She denies fever or chills.  Denies flank pain.  She reports that symptoms feel similar to UTI's that she has had in the past.  Patient is also complaining of whitish colored vaginal discharge that has also been present for the past 3 days.  No odor to the discharge.  She denies any vaginal itching ir external vaginal irritation.    The history is provided by the patient.    History reviewed. No pertinent past medical history.  History reviewed. No pertinent past surgical history.  History reviewed. No pertinent family history.  History  Substance Use Topics  . Smoking status: Never Smoker   . Smokeless tobacco: Not on file  . Alcohol Use: Yes    OB History   Grav Para Term Preterm Abortions TAB SAB Ect Mult Living                  Review of Systems  Constitutional: Negative for fever and chills.  Gastrointestinal: Negative for nausea, vomiting and abdominal pain.  Genitourinary: Positive for dysuria, urgency, frequency and vaginal discharge. Negative for flank pain, vaginal bleeding, difficulty urinating, vaginal pain and pelvic pain.  All other systems reviewed and are negative.    Allergies  Review of patient's allergies indicates no known allergies.  Home Medications   Current Outpatient Rx  Name  Route  Sig  Dispense  Refill  . acetaminophen (TYLENOL) 500 MG tablet   Oral   Take 1 tablet (500 mg total) by mouth every 6 (six) hours as needed for  pain.   30 tablet   0   . guaifenesin (ROBITUSSIN) 100 MG/5ML syrup   Oral   Take 5-10 mLs (100-200 mg total) by mouth every 4 (four) hours as needed for cough.   60 mL   0   . medroxyPROGESTERone (DEPO-PROVERA) 150 MG/ML injection   Intramuscular   Inject 150 mg into the muscle every 3 (three) months. Next one due July 08, 2012         . Multiple Vitamin (MULTIVITAMIN WITH MINERALS) TABS   Oral   Take 1 tablet by mouth daily.           BP 141/84  Pulse 89  Temp(Src) 98.1 F (36.7 C) (Oral)  Resp 18  SpO2 100%  Physical Exam  Nursing note and vitals reviewed. Constitutional: She appears well-developed and well-nourished. No distress.  HENT:  Head: Normocephalic and atraumatic.  Mouth/Throat: Oropharynx is clear and moist.  Neck: Normal range of motion. Neck supple.  Cardiovascular: Normal rate, regular rhythm and normal heart sounds.   Pulmonary/Chest: Effort normal and breath sounds normal.  Abdominal: Soft. Bowel sounds are normal. She exhibits no distension and no mass. There is no tenderness. There is no rebound, no guarding and no CVA tenderness.  Genitourinary: There is no rash, tenderness or lesion on the right labia. There is no rash, tenderness or lesion on the left labia. Cervix exhibits no motion tenderness. Right adnexum displays no mass, no tenderness and  no fullness. Left adnexum displays no mass, no tenderness and no fullness.  Whitish colored discharge in the vaginal vault  Neurological: She is alert.  Skin: Skin is warm and dry. She is not diaphoretic.  Psychiatric: She has a normal mood and affect.    ED Course  Procedures (including critical care time)  Labs Reviewed  URINALYSIS, ROUTINE W REFLEX MICROSCOPIC  POCT PREGNANCY, URINE   No results found.   No diagnosis found.    MDM  Patient presenting with urinary symptoms.  UA shows UTI.  No fever, flank pain, nausea, or vomiting.  Therefore, no Pyelonephritis at this time.  Patient  also complaining of vaginal discharge.  Wet prep showing Trichomonas.  No pain on pelvic exam.  Patient discharged home with Flagyl for Trichomonas.  Instructed patient not to drink alcohol while on Flagyl.  Patient given prescription for Macrobid for UTI.  Urine sent for culture.        Pascal Lux Kimball, PA-C 11/08/12 [phone]

## 2012-11-09 LAB — GC/CHLAMYDIA PROBE AMP: CT Probe RNA: NEGATIVE

## 2012-11-09 NOTE — ED Provider Notes (Signed)
Medical screening examination/treatment/procedure(s) were performed by non-physician practitioner and as supervising physician I was immediately available for consultation/collaboration.   Suzi Roots, MD 11/09/12 [phone]

## 2012-11-10 LAB — URINE CULTURE
Colony Count: >=100000
Special Requests: NORMAL

## 2012-11-11 ENCOUNTER — Telehealth (HOSPITAL_COMMUNITY): Payer: Self-pay | Admitting: Emergency Medicine

## 2012-11-11 NOTE — ED Notes (Signed)
Positive urnc- treated per protocol- no further follow-up at this time.  

## 2012-12-15 ENCOUNTER — Emergency Department (HOSPITAL_COMMUNITY)
Admission: EM | Admit: 2012-12-15 | Discharge: 2012-12-15 | Disposition: A | Discharge status: Home / Self Care | Payer: Medicaid Other | Attending: Emergency Medicine | Admitting: Emergency Medicine

## 2012-12-15 ENCOUNTER — Encounter (HOSPITAL_COMMUNITY): Payer: Self-pay | Admitting: Emergency Medicine

## 2012-12-15 DIAGNOSIS — N390 Urinary tract infection, site not specified: Secondary | ICD-10-CM

## 2012-12-15 DIAGNOSIS — R509 Fever, unspecified: Secondary | ICD-10-CM | POA: Insufficient documentation

## 2012-12-15 DIAGNOSIS — Z3202 Encounter for pregnancy test, result negative: Secondary | ICD-10-CM | POA: Insufficient documentation

## 2012-12-15 DIAGNOSIS — R112 Nausea with vomiting, unspecified: Secondary | ICD-10-CM | POA: Insufficient documentation

## 2012-12-15 DIAGNOSIS — Z79899 Other long term (current) drug therapy: Secondary | ICD-10-CM | POA: Insufficient documentation

## 2012-12-15 LAB — URINALYSIS, ROUTINE W REFLEX MICROSCOPIC
Glucose, UA: NEGATIVE mg/dL
Ketones, ur: NEGATIVE mg/dL
Protein, ur: 100 mg/dL — AB
pH: 5.5 (ref 5.0–8.0)

## 2012-12-15 LAB — BASIC METABOLIC PANEL
Calcium: 9.8 mg/dL (ref 8.4–10.5)
GFR calc non Af Amer: 71 mL/min — ABNORMAL LOW (ref >90)
Glucose, Bld: 117 mg/dL — ABNORMAL HIGH (ref 70–99)
Sodium: 136 mEq/L (ref 135–145)

## 2012-12-15 LAB — CBC WITH DIFFERENTIAL/PLATELET
Basophils Absolute: 0 10*3/uL (ref 0.0–0.1)
Eosinophils Absolute: 0.1 10*3/uL (ref 0.0–0.7)
Eosinophils Relative: 0 % (ref 0–5)
MCH: 31.3 pg (ref 26.0–34.0)
MCV: 85.3 fL (ref 78.0–100.0)
Platelets: 272 10*3/uL (ref 150–400)
RDW: 12.6 % (ref 11.5–15.5)

## 2012-12-15 LAB — URINE MICROSCOPIC-ADD ON

## 2012-12-15 MED ORDER — IBUPROFEN 800 MG PO TABS
800.0000 mg | ORAL_TABLET | Freq: Once | ORAL | Status: AC
  Administered 2012-12-15: 800 mg via ORAL
  Filled 2012-12-15: qty 1

## 2012-12-15 MED ORDER — CEPHALEXIN 500 MG PO CAPS: 500.0000 mg | ORAL_CAPSULE | Freq: Four times a day (QID) | ORAL | Status: DC

## 2012-12-15 MED ORDER — ONDANSETRON 4 MG PO TBDP: 4.0000 mg | ORAL_TABLET | Freq: Three times a day (TID) | ORAL | Status: DC | PRN

## 2012-12-15 MED ORDER — IBUPROFEN 800 MG PO TABS: 800.0000 mg | ORAL_TABLET | Freq: Three times a day (TID) | ORAL | Status: DC

## 2012-12-15 NOTE — ED Provider Notes (Signed)
History     CSN: [id number]  Arrival date & time 12/15/12  4098   First MD Initiated Contact with Patient 12/15/12 [phone]      Chief Complaint  Patient presents with  . Chills  . Generalized Body Aches    (Consider location/radiation/quality/duration/timing/severity/associated sxs/prior treatment) Patient is a 32 y.o. female presenting with musculoskeletal pain. The history is provided by the patient. No language interpreter was used.  Muscle Pain This is a new problem. The current episode started today. The problem occurs constantly. The problem has been gradually worsening. Associated symptoms include a fever, nausea and vomiting. Pertinent negatives include no abdominal pain. Nothing aggravates the symptoms. She has tried nothing for the symptoms.  Pt complains of feeling achy all over,  Pt complains of nausea  History reviewed. No pertinent past medical history.  History reviewed. No pertinent past surgical history.  No family history on file.  History  Substance Use Topics  . Smoking status: Never Smoker   . Smokeless tobacco: Not on file  . Alcohol Use: Yes    OB History   Grav Para Term Preterm Abortions TAB SAB Ect Mult Living                  Review of Systems  Constitutional: Positive for fever.  Gastrointestinal: Positive for nausea and vomiting. Negative for abdominal pain.  All other systems reviewed and are negative.    Allergies  Review of patient's allergies indicates no known allergies.  Home Medications   Current Outpatient Rx  Name  Route  Sig  Dispense  Refill  . acetaminophen (TYLENOL) 500 MG tablet   Oral   Take 1,000 mg by mouth every 6 (six) hours as needed for pain.         . medroxyPROGESTERone (DEPO-PROVERA) 150 MG/ML injection   Intramuscular   Inject 150 mg into the muscle every 3 (three) months. Next one due July 08, 2012           BP 125/70  Pulse 110  Temp(Src) 99.3 F (37.4 C) (Oral)  Resp 18  Ht 5\' 7"  (1.702  m)  Wt 220 lb (99.791 kg)  BMI 34.45 kg/m2  SpO2 100%  Physical Exam  Nursing note and vitals reviewed. Constitutional: She appears well-developed and well-nourished.  HENT:  Head: Normocephalic.  Right Ear: External ear normal.  Left Ear: External ear normal.  Nose: Nose normal.  Mouth/Throat: Oropharynx is clear and moist.  Eyes: Pupils are equal, round, and reactive to light.  Neck: Normal range of motion.  Cardiovascular: Normal rate and normal heart sounds.   Pulmonary/Chest: Effort normal and breath sounds normal.  Abdominal: Soft.  Musculoskeletal: Normal range of motion.  Neurological: She is alert.  Skin: Skin is warm.  Psychiatric: She has a normal mood and affect.    ED Course  Procedures (including critical care time)  Labs Reviewed  URINALYSIS, ROUTINE W REFLEX MICROSCOPIC - Abnormal; Notable for the following:    Color, Urine AMBER (*)    APPearance TURBID (*)    Hgb urine dipstick LARGE (*)    Protein, ur 100 (*)    Nitrite POSITIVE (*)    Leukocytes, UA LARGE (*)    All other components within normal limits  CBC WITH DIFFERENTIAL - Abnormal; Notable for the following:    WBC 16.5 (*)    HCT 34.9 (*)    MCHC 36.7 (*)    Neutro Abs 12.7 (*)    Lymphocytes Relative 7 (*)  Monocytes Relative 15 (*)    Monocytes Absolute 2.5 (*)    All other components within normal limits  BASIC METABOLIC PANEL - Abnormal; Notable for the following:    Glucose, Bld 117 (*)    GFR calc non Af Amer 71 (*)    GFR calc Af Amer 83 (*)    All other components within normal limits  URINE MICROSCOPIC-ADD ON - Abnormal; Notable for the following:    Bacteria, UA MANY (*)    All other components within normal limits  URINE CULTURE  PREGNANCY, URINE   No results found.   No diagnosis found.    MDM   Results for orders placed during the hospital encounter of 12/15/12  URINALYSIS, ROUTINE W REFLEX MICROSCOPIC      Result Value Range   Color, Urine AMBER (*) YELLOW    APPearance TURBID (*) CLEAR   Specific Gravity, Urine 1.014  1.005 - 1.030   pH 5.5  5.0 - 8.0   Glucose, UA NEGATIVE  NEGATIVE mg/dL   Hgb urine dipstick LARGE (*) NEGATIVE   Bilirubin Urine NEGATIVE  NEGATIVE   Ketones, ur NEGATIVE  NEGATIVE mg/dL   Protein, ur 454 (*) NEGATIVE mg/dL   Urobilinogen, UA 1.0  0.0 - 1.0 mg/dL   Nitrite POSITIVE (*) NEGATIVE   Leukocytes, UA LARGE (*) NEGATIVE  PREGNANCY, URINE      Result Value Range   Preg Test, Ur NEGATIVE  NEGATIVE  CBC WITH DIFFERENTIAL      Result Value Range   WBC 16.5 (*) 4.0 - 10.5 K/uL   RBC 4.09  3.87 - 5.11 MIL/uL   Hemoglobin 12.8  12.0 - 15.0 g/dL   HCT 09.8 (*) 11.9 - 14.7 %   MCV 85.3  78.0 - 100.0 fL   MCH 31.3  26.0 - 34.0 pg   MCHC 36.7 (*) 30.0 - 36.0 g/dL   RDW 82.9  56.2 - 13.0 %   Platelets 272  150 - 400 K/uL   Neutrophils Relative 77  43 - 77 %   Neutro Abs 12.7 (*) 1.7 - 7.7 K/uL   Lymphocytes Relative 7 (*) 12 - 46 %   Lymphs Abs 1.2  0.7 - 4.0 K/uL   Monocytes Relative 15 (*) 3 - 12 %   Monocytes Absolute 2.5 (*) 0.1 - 1.0 K/uL   Eosinophils Relative 0  0 - 5 %   Eosinophils Absolute 0.1  0.0 - 0.7 K/uL   Basophils Relative 0  0 - 1 %   Basophils Absolute 0.0  0.0 - 0.1 K/uL  BASIC METABOLIC PANEL      Result Value Range   Sodium 136  135 - 145 mEq/L   Potassium 4.0  3.5 - 5.1 mEq/L   Chloride 99  96 - 112 mEq/L   CO2 29  19 - 32 mEq/L   Glucose, Bld 117 (*) 70 - 99 mg/dL   BUN 9  6 - 23 mg/dL   Creatinine, Ser 8.65  0.50 - 1.10 mg/dL   Calcium 9.8  8.4 - 78.4 mg/dL   GFR calc non Af Amer 71 (*) >90 mL/min   GFR calc Af Amer 83 (*) >90 mL/min  URINE MICROSCOPIC-ADD ON      Result Value Range   Squamous Epithelial / LPF RARE  RARE   WBC, UA TOO NUMEROUS TO COUNT  <3 WBC/hpf   RBC / HPF 11-20  <3 RBC/hpf   Bacteria, UA MANY (*) RARE  No results found. Ua shows large nitrates and leukocytes,  WBC's TNTC, many bacteria  Pt counseled on diagnosis and treatment,.  I advised return if  any problems. Rx for keflex 500mg  qid. Ibuprofen and zofran      Elson Areas, PA-C 12/15/12 1017

## 2012-12-15 NOTE — ED Notes (Signed)
Patient states she has had chills and body aches since Sunday.  Patient advised she has been running a fever, but has not taken her temperature.   Patient claims sometimes she gets nauseated.

## 2012-12-16 LAB — URINE CULTURE: Colony Count: >=100000

## 2012-12-17 NOTE — ED Provider Notes (Signed)
Medical screening examination/treatment/procedure(s) were performed by non-physician practitioner and as supervising physician I was immediately available for consultation/collaboration.   Sanjana Folz, MD 12/17/12 1404 

## 2013-05-30 ENCOUNTER — Encounter (HOSPITAL_COMMUNITY): Payer: Self-pay | Admitting: Emergency Medicine

## 2013-05-30 ENCOUNTER — Emergency Department (INDEPENDENT_AMBULATORY_CARE_PROVIDER_SITE_OTHER)
Admission: EM | Admit: 2013-05-30 | Discharge: 2013-05-30 | Disposition: A | Discharge status: Home / Self Care | Payer: BC Managed Care – PPO | Source: Home / Self Care | Attending: Emergency Medicine | Admitting: Emergency Medicine

## 2013-05-30 DIAGNOSIS — K140 Glossitis: Secondary | ICD-10-CM

## 2013-05-30 MED ORDER — AMOXICILLIN-POT CLAVULANATE 875-125 MG PO TABS: 1.0000 | ORAL_TABLET | Freq: Two times a day (BID) | ORAL | Status: DC

## 2013-05-30 MED ORDER — HYDROCODONE-ACETAMINOPHEN 5-325 MG PO TABS: ORAL_TABLET | ORAL | Status: DC

## 2013-05-30 NOTE — ED Notes (Signed)
2 year duration tongue piercing (bar type) w/o issues until 1 week ago, when developed painful swollen area no known injury to mouth

## 2013-05-30 NOTE — ED Provider Notes (Signed)
Chief Complaint:   Chief Complaint  Patient presents with  . Oral Pain    History of Present Illness:   Lori Cross is a 32 year old female who has a one-week history of a painful lump in the center of her tongue. This is surrounding an area where she had a tongue piercing about 2 years ago. It's not troubled her until right now. There's been no purulent drainage. It hurts her to eat. She has no difficulty breathing. There is no swelling of the floor the mouth, no intraoral lesions, sore throat, fever, chills, or sweats.  Review of Systems:  Other than noted above, the patient denies any of the following symptoms: Systemic:  No fevers, chills, sweats, weight loss or gain, fatigue, or tiredness. Eye:  No redness, pain, discharge, itching, blurred vision, or diplopia. ENT:  No headache, nasal congestion, sneezing, itching, epistaxis, ear pain, congestion, decreased hearing, ringing in ears, vertigo, or tinnitus.  No oral lesions, sore throat, pain on swallowing, or hoarseness. Neck:  No mass, tenderness or adenopathy. Lungs:  No coughing, wheezing, or shortness of breath. Skin:  No rash or itching.  PMFSH:  Past medical history, family history, social history, meds, and allergies were reviewed. She takes Depo-Provera.  Physical Exam:   Vital signs:  BP 122/84  Pulse 82  Temp(Src) 98.3 F (36.8 C) (Oral)  Resp 24  SpO2 97% General:  Alert and oriented.  In no distress.  Skin warm and dry. Eye:  PERRL, full EOMs, lids and conjunctiva normal.   ENT:  TMs and canals clear.  Nasal mucosa not congested and without drainage.  Mucous membranes moist, no oral lesions, normal dentition, pharynx clear.  No cranial or facial pain to palplation. Exam of the tongue reveals a tongue piercing in the [place]. Surrounding this there was induration and pain to palpation. There was no fluctuance or purulent drainage. No swelling of the floor the mouth. Neck:  Supple, full ROM.  No adenopathy, tenderness or  mass.  Thyroid normal. Lungs:  Breath sounds clear and equal bilaterally.  No wheezes, rales or rhonchi. Heart:  Rhythm regular, without extrasystoles.  No gallops or murmers. Skin:  Clear, warm and dry.  Assessment:  The encounter diagnosis was Abscess, tongue.  I think she has a cellulitis or an abscess of her tongue due to irritation of the piercing. She was told to remove the piercing. She may need to get this pierced again after this has healed up. She was referred to ENT within the next day or 2 and should return immediately if she develops any difficulty breathing.  Plan:   1.  Meds:  The following meds were prescribed:   Discharge Medication List as of 05/30/2013 10:28 AM    START taking these medications   Details  amoxicillin-clavulanate (AUGMENTIN) 875-125 MG per tablet Take 1 tablet by mouth 2 (two) times daily., Starting 05/30/2013, Until Discontinued, Normal    HYDROcodone-acetaminophen (NORCO/VICODIN) 5-325 MG per tablet 1 to 2 tabs every 4 to 6 hours as needed for pain., Print        2.  Patient Education/Counseling:  The patient was given appropriate handouts, self care instructions, and instructed in symptomatic relief.  Suggested hot saltwater mouthwashes.  3.  Follow up:  The patient was told to follow up if no better in 3 to 4 days, if becoming worse in any way, and given some red flag symptoms such as difficulty breathing which would prompt immediate return.  Follow up with Dr.  Teoh in the next day or 2.     Reuben Likes, MD 05/30/13 [phone]

## 2013-07-12 ENCOUNTER — Emergency Department (HOSPITAL_COMMUNITY)
Admission: EM | Admit: 2013-07-12 | Discharge: 2013-07-12 | Disposition: A | Discharge status: Home / Self Care | Payer: BC Managed Care – PPO | Attending: Emergency Medicine | Admitting: Emergency Medicine

## 2013-07-12 ENCOUNTER — Encounter (HOSPITAL_COMMUNITY): Payer: Self-pay | Admitting: Emergency Medicine

## 2013-07-12 DIAGNOSIS — J029 Acute pharyngitis, unspecified: Secondary | ICD-10-CM | POA: Insufficient documentation

## 2013-07-12 DIAGNOSIS — Z79899 Other long term (current) drug therapy: Secondary | ICD-10-CM | POA: Insufficient documentation

## 2013-07-12 DIAGNOSIS — R1310 Dysphagia, unspecified: Secondary | ICD-10-CM | POA: Insufficient documentation

## 2013-07-12 NOTE — ED Provider Notes (Signed)
Medical screening examination/treatment/procedure(s) were performed by non-physician practitioner and as supervising physician I was immediately available for consultation/collaboration.  EKG Interpretation   None        Geoffery Lyons, MD 07/12/13 2219

## 2013-07-12 NOTE — ED Provider Notes (Signed)
CSN: [id number]     Arrival date & time 07/12/13  2031 History  This chart was scribed for non-physician practitioner Sharilyn Sites, PA-C working with Geoffery Lyons, MD by Valera Castle, ED scribe. This patient was seen in room TR06C/TR06C and the patient's care was started at 8:52 PM.   Chief Complaint  Patient presents with  . Sore Throat   The history is provided by the patient. No language interpreter was used.   HPI Comments: Lori Cross is a 32 y.o. female who presents to the Emergency Department complaining of sudden, moderate, intermittent dysphagia, onset 3 days ago. She denies current sore throat, and reports her throat only hurts with swallowing. Notes sx seem worse in the morning and she has been feeling a lot of phlegm in the back of her throat.  She denies having tried anything for the pain. She denies h/o similar symptoms. She denies fever, ear pain, or any other associated symptoms. She denies any allergies. She denies any medical history.  PCP - Per Patient No Pcp  No past medical history on file. No past surgical history on file. No family history on file. History  Substance Use Topics  . Smoking status: Never Smoker   . Smokeless tobacco: Not on file  . Alcohol Use: Yes   OB History   Grav Para Term Preterm Abortions TAB SAB Ect Mult Living                 Review of Systems  Constitutional: Negative for fever.  HENT: Positive for sore throat. Negative for ear pain.   All other systems reviewed and are negative.   Allergies  Review of patient's allergies indicates no known allergies.  Home Medications   Current Outpatient Rx  Name  Route  Sig  Dispense  Refill  . acetaminophen (TYLENOL) 500 MG tablet   Oral   Take 1,000 mg by mouth every 6 (six) hours as needed for pain.         Marland Kitchen amoxicillin-clavulanate (AUGMENTIN) 875-125 MG per tablet   Oral   Take 1 tablet by mouth 2 (two) times daily.   20 tablet   0   . cephALEXin (KEFLEX) 500 MG capsule   Oral   Take 1 capsule (500 mg total) by mouth 4 (four) times daily.   40 capsule   0   . HYDROcodone-acetaminophen (NORCO/VICODIN) 5-325 MG per tablet      1 to 2 tabs every 4 to 6 hours as needed for pain.   20 tablet   0   . ibuprofen (ADVIL,MOTRIN) 800 MG tablet   Oral   Take 1 tablet (800 mg total) by mouth 3 (three) times daily.   21 tablet   0   . medroxyPROGESTERone (DEPO-PROVERA) 150 MG/ML injection   Intramuscular   Inject 150 mg into the muscle every 3 (three) months. Next one due July 08, 2012         . ondansetron (ZOFRAN ODT) 4 MG disintegrating tablet   Oral   Take 1 tablet (4 mg total) by mouth every 8 (eight) hours as needed for nausea.   10 tablet   0    Triage Vitals:BP 129/86  Pulse 100  Temp(Src) 99.9 F (37.7 C) (Oral)  Resp 18  Ht 5\' 5"  (1.651 m)  Wt 235 lb 4 oz (106.709 kg)  BMI 39.15 kg/m2  SpO2 97%  Physical Exam  Nursing note and vitals reviewed. Constitutional: She is oriented to person, place, and  time. She appears well-developed and well-nourished. No distress.  HENT:  Head: Normocephalic and atraumatic.  Mouth/Throat: Uvula is midline, oropharynx is clear and moist and mucous membranes are normal. No trismus in the jaw. No oropharyngeal exudate, posterior oropharyngeal edema, posterior oropharyngeal erythema or tonsillar abscesses.  Tonsils normal in appearance bilaterally; PND noted in oropharynx; uvula midline; no PTA; handling secretions appropriately; no difficulty swallowing or speaking  Eyes: Conjunctivae and EOM are normal. Pupils are equal, round, and reactive to light.  Neck: Normal range of motion. Neck supple.  Cardiovascular: Normal rate, regular rhythm and normal heart sounds.   Pulmonary/Chest: Effort normal and breath sounds normal. No respiratory distress. She has no wheezes.  Musculoskeletal: Normal range of motion.  Neurological: She is alert and oriented to person, place, and time.  Skin: Skin is warm and dry.  She is not diaphoretic.  Psychiatric: She has a normal mood and affect.    ED Course  Procedures (including critical care time)  DIAGNOSTIC STUDIES: Oxygen Saturation is 97% on room air, normal by my interpretation.    COORDINATION OF CARE: 8:55 PM-Discussed treatment plan which includes strep screen with pt at bedside and pt agreed to plan.   Labs Review Labs Reviewed - No data to display Imaging Review No results found.  EKG Interpretation   None       MDM   1. Pharyngitis     Rapid strep negative, culture pending.  Instructed may use OTC chloraseptic spray as needed to help with sore throat.  Instructed to return to the ED for new or worsening symptoms.  I personally performed the services described in this documentation, which was scribed in my presence. The recorded information has been reviewed and is accurate.  Garlon Hatchet, PA-C 07/12/13 2204

## 2013-07-12 NOTE — ED Notes (Signed)
Pt states 3 days of hurting while swallowing. Pt states that when she is not swallowing her throat does not hurt.

## 2013-07-14 LAB — CULTURE, GROUP A STREP

## 2014-08-21 ENCOUNTER — Encounter (HOSPITAL_COMMUNITY): Payer: Self-pay | Admitting: Emergency Medicine

## 2014-08-21 DIAGNOSIS — A409 Streptococcal sepsis, unspecified: Principal | ICD-10-CM | POA: Diagnosis present

## 2014-08-21 DIAGNOSIS — Z6830 Body mass index (BMI) 30.0-30.9, adult: Secondary | ICD-10-CM

## 2014-08-21 DIAGNOSIS — E876 Hypokalemia: Secondary | ICD-10-CM | POA: Diagnosis present

## 2014-08-21 DIAGNOSIS — E669 Obesity, unspecified: Secondary | ICD-10-CM | POA: Diagnosis present

## 2014-08-21 DIAGNOSIS — N12 Tubulo-interstitial nephritis, not specified as acute or chronic: Secondary | ICD-10-CM | POA: Diagnosis present

## 2014-08-21 DIAGNOSIS — D649 Anemia, unspecified: Secondary | ICD-10-CM | POA: Diagnosis present

## 2014-08-21 DIAGNOSIS — N179 Acute kidney failure, unspecified: Secondary | ICD-10-CM | POA: Diagnosis present

## 2014-08-21 DIAGNOSIS — R509 Fever, unspecified: Secondary | ICD-10-CM | POA: Diagnosis not present

## 2014-08-21 LAB — COMPREHENSIVE METABOLIC PANEL
ALBUMIN: 3.7 g/dL (ref 3.5–5.2)
ALT: 21 U/L (ref 0–35)
ANION GAP: 9 (ref 5–15)
AST: 26 U/L (ref 0–37)
Alkaline Phosphatase: 63 U/L (ref 39–117)
BILIRUBIN TOTAL: 0.8 mg/dL (ref 0.3–1.2)
BUN: 9 mg/dL (ref 6–23)
CALCIUM: 8.8 mg/dL (ref 8.4–10.5)
CHLORIDE: 99 meq/L (ref 96–112)
CO2: 23 mmol/L (ref 19–32)
CREATININE: 1.12 mg/dL — AB (ref 0.50–1.10)
GFR calc Af Amer: 75 mL/min — ABNORMAL LOW (ref >90)
GFR calc non Af Amer: 65 mL/min — ABNORMAL LOW (ref >90)
Glucose, Bld: 107 mg/dL — ABNORMAL HIGH (ref 70–99)
Potassium: 3.2 mmol/L — ABNORMAL LOW (ref 3.5–5.1)
Sodium: 131 mmol/L — ABNORMAL LOW (ref 135–145)
Total Protein: 7.2 g/dL (ref 6.0–8.3)

## 2014-08-21 LAB — CBC WITH DIFFERENTIAL/PLATELET
BASOS ABS: 0 10*3/uL (ref 0.0–0.1)
Basophils Relative: 0 % (ref 0–1)
EOS ABS: 0 10*3/uL (ref 0.0–0.7)
Eosinophils Relative: 0 % (ref 0–5)
HEMATOCRIT: 36.3 % (ref 36.0–46.0)
Hemoglobin: 13 g/dL (ref 12.0–15.0)
LYMPHS ABS: 2.2 10*3/uL (ref 0.7–4.0)
LYMPHS PCT: 10 % — AB (ref 12–46)
MCH: 31.9 pg (ref 26.0–34.0)
MCHC: 35.8 g/dL (ref 30.0–36.0)
MCV: 89.2 fL (ref 78.0–100.0)
Monocytes Absolute: 2.8 10*3/uL — ABNORMAL HIGH (ref 0.1–1.0)
Monocytes Relative: 13 % — ABNORMAL HIGH (ref 3–12)
NEUTROS ABS: 16.7 10*3/uL — AB (ref 1.7–7.7)
Neutrophils Relative %: 77 % (ref 43–77)
Platelets: 280 10*3/uL (ref 150–400)
RBC: 4.07 MIL/uL (ref 3.87–5.11)
RDW: 13 % (ref 11.5–15.5)
WBC: 21.7 10*3/uL — ABNORMAL HIGH (ref 4.0–10.5)

## 2014-08-21 LAB — URINE MICROSCOPIC-ADD ON

## 2014-08-21 LAB — URINALYSIS, ROUTINE W REFLEX MICROSCOPIC
Bilirubin Urine: NEGATIVE
Glucose, UA: NEGATIVE mg/dL
KETONES UR: NEGATIVE mg/dL
NITRITE: NEGATIVE
PH: 6 (ref 5.0–8.0)
Protein, ur: 30 mg/dL — AB
SPECIFIC GRAVITY, URINE: 1.013 (ref 1.005–1.030)
UROBILINOGEN UA: 1 mg/dL (ref 0.0–1.0)

## 2014-08-21 LAB — PREGNANCY, URINE: Preg Test, Ur: NEGATIVE

## 2014-08-21 MED ORDER — ACETAMINOPHEN 325 MG PO TABS
650.0000 mg | ORAL_TABLET | Freq: Once | ORAL | Status: AC
  Administered 2014-08-21: 650 mg via ORAL
  Filled 2014-08-21: qty 2

## 2014-08-21 NOTE — ED Notes (Signed)
Pt. reports generalized body aches , fever , chills and nausea onset last Saturday , denies cough or congestion . Respirations unlabored .

## 2014-08-22 ENCOUNTER — Emergency Department (HOSPITAL_COMMUNITY): Payer: 59

## 2014-08-22 ENCOUNTER — Encounter (HOSPITAL_COMMUNITY): Payer: Self-pay

## 2014-08-22 ENCOUNTER — Inpatient Hospital Stay (HOSPITAL_COMMUNITY)
Admission: EM | Admit: 2014-08-22 | Discharge: 2014-08-25 | DRG: 872 | Disposition: A | Discharge status: Home / Self Care | Payer: 59 | Attending: Internal Medicine | Admitting: Internal Medicine

## 2014-08-22 DIAGNOSIS — M549 Dorsalgia, unspecified: Secondary | ICD-10-CM

## 2014-08-22 DIAGNOSIS — N179 Acute kidney failure, unspecified: Secondary | ICD-10-CM | POA: Diagnosis present

## 2014-08-22 DIAGNOSIS — E876 Hypokalemia: Secondary | ICD-10-CM | POA: Diagnosis present

## 2014-08-22 DIAGNOSIS — A419 Sepsis, unspecified organism: Secondary | ICD-10-CM | POA: Diagnosis not present

## 2014-08-22 DIAGNOSIS — N12 Tubulo-interstitial nephritis, not specified as acute or chronic: Secondary | ICD-10-CM | POA: Diagnosis not present

## 2014-08-22 DIAGNOSIS — N390 Urinary tract infection, site not specified: Secondary | ICD-10-CM | POA: Diagnosis present

## 2014-08-22 DIAGNOSIS — E669 Obesity, unspecified: Secondary | ICD-10-CM | POA: Diagnosis present

## 2014-08-22 DIAGNOSIS — A4151 Sepsis due to Escherichia coli [E. coli]: Secondary | ICD-10-CM | POA: Diagnosis not present

## 2014-08-22 DIAGNOSIS — A409 Streptococcal sepsis, unspecified: Secondary | ICD-10-CM | POA: Diagnosis present

## 2014-08-22 DIAGNOSIS — D649 Anemia, unspecified: Secondary | ICD-10-CM | POA: Diagnosis present

## 2014-08-22 DIAGNOSIS — R509 Fever, unspecified: Secondary | ICD-10-CM | POA: Diagnosis present

## 2014-08-22 DIAGNOSIS — Z6830 Body mass index (BMI) 30.0-30.9, adult: Secondary | ICD-10-CM | POA: Diagnosis not present

## 2014-08-22 HISTORY — DX: Other specified health status: Z78.9

## 2014-08-22 LAB — COMPREHENSIVE METABOLIC PANEL
ALK PHOS: 51 U/L (ref 39–117)
ALT: 18 U/L (ref 0–35)
ANION GAP: 6 (ref 5–15)
AST: 21 U/L (ref 0–37)
Albumin: 2.8 g/dL — ABNORMAL LOW (ref 3.5–5.2)
BUN: 8 mg/dL (ref 6–23)
CHLORIDE: 105 meq/L (ref 96–112)
CO2: 25 mmol/L (ref 19–32)
CREATININE: 1.01 mg/dL (ref 0.50–1.10)
Calcium: 7.9 mg/dL — ABNORMAL LOW (ref 8.4–10.5)
GFR calc non Af Amer: 73 mL/min — ABNORMAL LOW (ref >90)
GFR, EST AFRICAN AMERICAN: 85 mL/min — AB (ref >90)
Glucose, Bld: 118 mg/dL — ABNORMAL HIGH (ref 70–99)
Potassium: 3.3 mmol/L — ABNORMAL LOW (ref 3.5–5.1)
Sodium: 136 mmol/L (ref 135–145)
Total Bilirubin: 1 mg/dL (ref 0.3–1.2)
Total Protein: 5.8 g/dL — ABNORMAL LOW (ref 6.0–8.3)

## 2014-08-22 LAB — CBC WITH DIFFERENTIAL/PLATELET
BASOS ABS: 0 10*3/uL (ref 0.0–0.1)
BASOS PCT: 0 % (ref 0–1)
EOS ABS: 0 10*3/uL (ref 0.0–0.7)
Eosinophils Relative: 0 % (ref 0–5)
HCT: 32.5 % — ABNORMAL LOW (ref 36.0–46.0)
Hemoglobin: 11.2 g/dL — ABNORMAL LOW (ref 12.0–15.0)
Lymphocytes Relative: 8 % — ABNORMAL LOW (ref 12–46)
Lymphs Abs: 1.6 10*3/uL (ref 0.7–4.0)
MCH: 30.2 pg (ref 26.0–34.0)
MCHC: 34.5 g/dL (ref 30.0–36.0)
MCV: 87.6 fL (ref 78.0–100.0)
MONO ABS: 2.5 10*3/uL — AB (ref 0.1–1.0)
Monocytes Relative: 13 % — ABNORMAL HIGH (ref 3–12)
Neutro Abs: 15.6 10*3/uL — ABNORMAL HIGH (ref 1.7–7.7)
Neutrophils Relative %: 79 % — ABNORMAL HIGH (ref 43–77)
Platelets: 225 10*3/uL (ref 150–400)
RBC: 3.71 MIL/uL — ABNORMAL LOW (ref 3.87–5.11)
RDW: 13 % (ref 11.5–15.5)
WBC: 19.7 10*3/uL — ABNORMAL HIGH (ref 4.0–10.5)

## 2014-08-22 LAB — I-STAT CG4 LACTIC ACID, ED: LACTIC ACID, VENOUS: 0.63 mmol/L (ref 0.5–2.2)

## 2014-08-22 MED ORDER — DEXTROSE 5 % IV SOLN
2.0000 g | Freq: Once | INTRAVENOUS | Status: AC
  Administered 2014-08-22: 2 g via INTRAVENOUS
  Filled 2014-08-22: qty 2

## 2014-08-22 MED ORDER — ACETAMINOPHEN 325 MG PO TABS
650.0000 mg | ORAL_TABLET | Freq: Four times a day (QID) | ORAL | Status: DC | PRN
  Administered 2014-08-22 – 2014-08-25 (×4): 650 mg via ORAL
  Filled 2014-08-22 (×4): qty 2

## 2014-08-22 MED ORDER — PIPERACILLIN-TAZOBACTAM 3.375 G IVPB 30 MIN
3.3750 g | Freq: Once | INTRAVENOUS | Status: AC
  Administered 2014-08-22: 3.375 g via INTRAVENOUS
  Filled 2014-08-22: qty 50

## 2014-08-22 MED ORDER — IBUPROFEN 200 MG PO TABS
600.0000 mg | ORAL_TABLET | Freq: Four times a day (QID) | ORAL | Status: AC | PRN
  Administered 2014-08-22 – 2014-08-24 (×3): 600 mg via ORAL
  Filled 2014-08-22 (×3): qty 3

## 2014-08-22 MED ORDER — ONDANSETRON HCL 4 MG/2ML IJ SOLN
4.0000 mg | Freq: Four times a day (QID) | INTRAMUSCULAR | Status: DC | PRN
  Administered 2014-08-22 – 2014-08-25 (×3): 4 mg via INTRAVENOUS
  Filled 2014-08-22 (×3): qty 2

## 2014-08-22 MED ORDER — SODIUM CHLORIDE 0.9 % IV SOLN
1000.0000 mL | Freq: Once | INTRAVENOUS | Status: AC
  Administered 2014-08-22: 1000 mL via INTRAVENOUS

## 2014-08-22 MED ORDER — ZOLPIDEM TARTRATE 5 MG PO TABS
5.0000 mg | ORAL_TABLET | Freq: Once | ORAL | Status: AC
  Administered 2014-08-22: 5 mg via ORAL
  Filled 2014-08-22: qty 1

## 2014-08-22 MED ORDER — SODIUM CHLORIDE 0.9 % IV SOLN
INTRAVENOUS | Status: AC
  Administered 2014-08-22: 13:00:00 via INTRAVENOUS

## 2014-08-22 MED ORDER — INFLUENZA VAC SPLIT QUAD 0.5 ML IM SUSY
0.5000 mL | PREFILLED_SYRINGE | INTRAMUSCULAR | Status: AC
  Administered 2014-08-23: 0.5 mL via INTRAMUSCULAR
  Filled 2014-08-22: qty 0.5

## 2014-08-22 MED ORDER — POTASSIUM CHLORIDE CRYS ER 20 MEQ PO TBCR
40.0000 meq | EXTENDED_RELEASE_TABLET | Freq: Once | ORAL | Status: AC
  Administered 2014-08-22: 40 meq via ORAL
  Filled 2014-08-22: qty 2

## 2014-08-22 MED ORDER — MORPHINE SULFATE 2 MG/ML IJ SOLN
1.0000 mg | INTRAMUSCULAR | Status: DC | PRN
  Administered 2014-08-22: 1 mg via INTRAVENOUS

## 2014-08-22 MED ORDER — SODIUM CHLORIDE 0.9 % IV SOLN
1000.0000 mL | INTRAVENOUS | Status: DC
  Administered 2014-08-22: 1000 mL via INTRAVENOUS

## 2014-08-22 MED ORDER — SODIUM CHLORIDE 0.9 % IV BOLUS (SEPSIS)
250.0000 mL | Freq: Once | INTRAVENOUS | Status: AC
  Administered 2014-08-22: 250 mL via INTRAVENOUS

## 2014-08-22 MED ORDER — ACETAMINOPHEN 650 MG RE SUPP: 650.0000 mg | Freq: Four times a day (QID) | RECTAL | Status: DC | PRN

## 2014-08-22 MED ORDER — IBUPROFEN 800 MG PO TABS
800.0000 mg | ORAL_TABLET | Freq: Once | ORAL | Status: AC
Start: 2014-08-22 — End: 2014-08-22
  Administered 2014-08-22: 800 mg via ORAL
  Filled 2014-08-22: qty 1

## 2014-08-22 MED ORDER — PIPERACILLIN-TAZOBACTAM 3.375 G IVPB
3.3750 g | Freq: Three times a day (TID) | INTRAVENOUS | Status: DC
  Administered 2014-08-22 – 2014-08-24 (×6): 3.375 g via INTRAVENOUS
  Filled 2014-08-22 (×10): qty 50

## 2014-08-22 MED ORDER — ONDANSETRON HCL 4 MG PO TABS
4.0000 mg | ORAL_TABLET | Freq: Four times a day (QID) | ORAL | Status: DC | PRN
  Administered 2014-08-25: 4 mg via ORAL
  Filled 2014-08-22 (×2): qty 1

## 2014-08-22 NOTE — Progress Notes (Signed)
PROGRESS NOTE    Lori SongLisa A Cape JYN:[id number]:[id number] DOB: 04/14/1981 DOA: 08/22/2014 PCP: Per Patient No Pcp  HPI/Brief narrative [age] year old female patient with no significant past medical history presented with 2 days history of fevers, chills, generalized RD aches, urinary frequency and discomfort towards the end of micturition. She did have mild pain in the right lower back. In the ED, UA suggestive of UTI. CT abdomen and pelvis showed possible mild bilateral hydronephrosis. She was admitted for sepsis related to UTI.   Assessment/Plan:  1. Sepsis secondary to UTI: Continue IV Zosyn pending urine culture results. Continue IV fluids. 2. Hypokalemia: Replace and follow. 3. Mild acute kidney injury: Resolved after hydration. 4. Anemia: Possibly related to hemodilution and acute infection. Follow CBC in a.m. 5. Leukocytosis: Related to problem #1. 6. Possible mild bilateral hydronephrosis and bladder distention: As per urology note and discussion with Dr. Annabell HowellsWrenn 08/22/13, recommend repeating renal ultrasound in 2-3 days to reassess.   Code Status: Full Family Communication: None at bedside Disposition Plan: Home when medically stable.   Consultants:  Discussed with Urology/Dr. Annabell HowellsWrenn.  Procedures:  None  Antibiotics:  IV Zosyn   Subjective: Overall feels better. Mild chills. Body pains reduced. Denies back pain. Denies GI or respiratory symptoms.  Objective: Filed Vitals:   08/22/14 0636 08/22/14 0651 08/22/14 0936 08/22/14 1435  BP:  115/68 100/70 113/66  Pulse:  98 106 103  Temp:  99.6 F (37.6 C) 98.9 F (37.2 C) 101.9 F (38.8 C)  TempSrc:  Oral Oral Oral  Resp:  18 18 18   Height: 5\' 7"  (1.702 m)     Weight: 103.3 kg (227 lb 11.8 oz)     SpO2:   100% 100%    Intake/Output Summary (Last 24 hours) at 08/22/14 1540 Last data filed at 08/22/14 1446  Gross per 24 hour  Intake   2000 ml  Output    600 ml  Net   1400 ml   Filed Weights   08/21/14 2229 08/22/14 0636   Weight: 99.791 kg (220 lb) 103.3 kg (227 lb 11.8 oz)     Exam:  General exam: Pleasant young female, moderately built and morbidly obese, lying comfortably in bed. Does not appear septic or toxic. Respiratory system: Clear. No increased work of breathing. Cardiovascular system: S1 & S2 heard, RRR. No JVD, murmurs, gallops, clicks or pedal edema. Gastrointestinal system: Abdomen is nondistended, soft and nontender. Normal bowel sounds heard. No renal angle tenderness. Central nervous system: Alert and oriented. No focal neurological deficits. Extremities: Symmetric 5 x 5 power.   Data Reviewed: Basic Metabolic Panel:  Recent Labs Lab 08/21/14 2234 08/22/14 0755  NA 131* 136  K 3.2* 3.3*  CL 99 105  CO2 23 25  GLUCOSE 107* 118*  BUN 9 8  CREATININE 1.12* 1.01  CALCIUM 8.8 7.9*   Liver Function Tests:  Recent Labs Lab 08/21/14 2234 08/22/14 0755  AST 26 21  ALT 21 18  ALKPHOS 63 51  BILITOT 0.8 1.0  PROT 7.2 5.8*  ALBUMIN 3.7 2.8*   No results for input(s): LIPASE, AMYLASE in the last 168 hours. No results for input(s): AMMONIA in the last 168 hours. CBC:  Recent Labs Lab 08/21/14 2234 08/22/14 0755  WBC 21.7* 19.7*  NEUTROABS 16.7* 15.6*  HGB 13.0 11.2*  HCT 36.3 32.5*  MCV 89.2 87.6  PLT 280 225   Cardiac Enzymes: No results for input(s): CKTOTAL, CKMB, CKMBINDEX, TROPONINI in the last 168 hours. BNP (last 3 results)  No results for input(s): PROBNP in the last 8760 hours. CBG: No results for input(s): GLUCAP in the last 168 hours.  No results found for this or any previous visit (from the past 240 hour(s)).       Studies: Ct Renal Stone Study  08/22/2014   CLINICAL DATA:  Generalized abdominal pain and mid back pain beginning 2 days ago. No history of of kidneys stones or hematuria.  EXAM: CT ABDOMEN AND PELVIS WITHOUT CONTRAST  TECHNIQUE: Multidetector CT imaging of the abdomen and pelvis was performed following the standard protocol without  IV contrast.  COMPARISON:  None.  FINDINGS: Moderate respiratory motion degraded examination of the upper abdomen.  LUNG BASES: Included view of the lung bases are clear. The visualized heart and pericardium are unremarkable.  KIDNEYS/BLADDER: Respiratory motion degraded examination. Kidneys are orthotopic, demonstrating normal size and morphology. No possible mild bilateral hydronephrosis. No definite nephrolithiasis. Limited assessment for renal masses on this nonenhanced examination. The unopacified ureters are normal in course and caliber. Urinary bladder is well distended with mild circumferential wall thickening.  SOLID ORGANS: The liver, spleen, gallbladder, pancreas and adrenal glands are unremarkable for this non-contrast examination.  GASTROINTESTINAL TRACT: The stomach, small and large bowel are normal in course and caliber without inflammatory changes, the sensitivity may be decreased by lack of enteric contrast. Normal appendix.  PERITONEUM/RETROPERITONEUM: No intraperitoneal free fluid nor free air. Aortoiliac vessels are normal in course and caliber. No lymphadenopathy by CT size criteria. Internal reproductive organs are unremarkable.  SOFT TISSUES/ OSSEOUS STRUCTURES: Nonsuspicious. Small fat containing umbilical hernia.  IMPRESSION: Motion degraded examination. Suspected mild bilateral hydronephrosis. Mild urinary bladder wall thickening can be seen with cystitis. No definite urolithiasis.   Electronically Signed   By: Awilda Metro   On: 08/22/2014 02:11        Scheduled Meds: . piperacillin-tazobactam (ZOSYN)  IV  3.375 g Intravenous 3 times per day   Continuous Infusions: . sodium chloride 125 mL/hr at 08/22/14 1246    Principal Problem:   Sepsis Active Problems:   Pyelonephritis   UTI (lower urinary tract infection)    Time spent: 30 minutes.    Marcellus Scott, MD, FACP, FHM. [facility] Pager [phone]  If 7PM-7AM, please contact  night-coverage [email or web address] Password [email or web address] 08/22/2014, 3:40 PM    LOS: 0 days

## 2014-08-22 NOTE — ED Notes (Signed)
Attempted to call report

## 2014-08-22 NOTE — Progress Notes (Signed)
Pt admitted to room 4N23 from ED. Pt is alert and oriented. Pt has no complaints of pain.  Will continue to monitor.

## 2014-08-22 NOTE — ED Notes (Signed)
[facility] Hale DroneKarkandy.

## 2014-08-22 NOTE — Progress Notes (Signed)
ANTIBIOTIC CONSULT NOTE - INITIAL  Pharmacy Consult for Zosyn Indication: Intra-abdominal infection  No Known Allergies Patient Measurements: Height: 5\' 7"  (170.2 cm) Weight: 227 lb 11.8 oz (103.3 kg) IBW/kg (Calculated) : 61.6 Vital Signs: Temp: 98.9 F (37.2 C) (01/05 0936) Temp Source: Oral (01/05 0936) BP: 100/70 mmHg (01/05 0936) Pulse Rate: 106 (01/05 0936) Intake/Output from previous day: 01/04 0701 - 01/05 0700 In: 2000 [I.V.:2000] Out: -  Labs:  Recent Labs  08/21/14 2234 08/22/14 0755  WBC 21.7* 19.7*  HGB 13.0 11.2*  PLT 280 225  CREATININE 1.12* 1.01   Estimated Creatinine Clearance: 99.8 mL/min (by C-G formula based on Cr of 1.01). No results for input(s): VANCOTROUGH, VANCOPEAK, VANCORANDOM, GENTTROUGH, GENTPEAK, GENTRANDOM, TOBRATROUGH, TOBRAPEAK, TOBRARND, AMIKACINPEAK, AMIKACINTROU, AMIKACIN in the last 72 hours.   Microbiology: No results found for this or any previous visit (from the past 720 hour(s)).  Medical History: Past Medical History  Diagnosis Date  . Medical history non-contributory     Medications:  Anti-infectives    Start     Dose/Rate Route Frequency Ordered Stop   08/22/14 0715  piperacillin-tazobactam (ZOSYN) IVPB 3.375 g     3.375 g12.5 mL/hr over 240 Minutes Intravenous 3 times per day 08/22/14 0702     08/22/14 0545  piperacillin-tazobactam (ZOSYN) IVPB 3.375 g     3.375 g100 mL/hr over 30 Minutes Intravenous  Once 08/22/14 0531 08/22/14 0634   08/22/14 0130  cefTRIAXone (ROCEPHIN) 2 g in dextrose 5 % 50 mL IVPB     2 g100 mL/hr over 30 Minutes Intravenous  Once 08/22/14 0126 08/22/14 0546     Assessment: 34 year old female on Zosyn for r/o intra-abdominal infection.  WBC 21.7 >>19.7, Tmax 102.7. CrCl 95-100 mL/min.   Goal of Therapy:  Clinical resolution of infection  Plan:  Zosyn 3.375g IV q8h - 4 hr infusion.   Lori Cross, Lori Cross 08/22/2014,1:46 PM

## 2014-08-22 NOTE — Progress Notes (Signed)
CARE MANAGEMENT NOTE 08/22/2014  Patient:  Lori Cross,Lori Cross   Account Number:  [id number]  Date Initiated:  08/22/2014  Documentation initiated by:  Jiles CrockerHANDLER,Brynne Doane  Subjective/Objective Assessment:   ADMITTED WITH SEPSIS     Action/Plan:   CM FOLLOWING FOR DCP   Anticipated DC Date:  08/26/2014   Anticipated DC Plan:  HOME/SELF CARE  In-house referral  Financial Counselor      DC Planning Services  CM consult         Status of service:  In process, will continue to follow Medicare Important Message given?   (If response is "NO", the following Medicare IM given date fields will be blank)  Per UR Regulation:  Reviewed for med. necessity/level of care/duration of stay  Comments:  1/5/2016Abelino Derrick- B Maxamus Colao RN,BSN,MHA [phone]

## 2014-08-22 NOTE — ED Notes (Signed)
Dr. Karkandy, hospitalist, at the bedside.  

## 2014-08-22 NOTE — ED Provider Notes (Signed)
CSN: [id number]     Arrival date & time 08/21/14  2216 History  This chart was scribed for [name], MD by [name], ED Scribe. This patient was seen in room A11C/A11C and the patient's care was started at 1:20 AM.    Chief Complaint  Patient presents with  . Generalized Body Aches    The history is provided by the patient. No language interpreter was used.   HPI Comments: Lori Cross is a 34 y.o. female who presents to the Emergency Department complaining of body aches with onset 3 days ago.  Pt notes associated chills, fever, appetite change, mid back pain, and frequency.  Pt notes she threw up once after taking ibuprofen yesterday for relief.  Pt is a Conservation officer, [employer]. Pt denies medication use. Pt denies cough, sore throat, dizziness, lightheadedness, ear pain and dysuria.     Pt does not have a PCP.   History reviewed. No pertinent past medical history. History reviewed. No pertinent past surgical history. No family history on file. History  Substance Use Topics  . Smoking status: Never Smoker   . Smokeless tobacco: Not on file  . Alcohol Use: Yes   OB History    No data available     Review of Systems  Constitutional: Positive for fever, chills and appetite change.  HENT: Negative for ear pain and sore throat.   Respiratory: Negative for cough.   Genitourinary: Negative for dysuria.  Musculoskeletal: Positive for myalgias and back pain.  Neurological: Negative for dizziness and light-headedness.  All other systems reviewed and are negative.     Allergies  Review of patient's allergies indicates no known allergies.  Home Medications   Prior to Admission medications   Medication Sig Start Date End Date Taking? Authorizing Provider  etonogestrel (IMPLANON) 68 MG IMPL implant Inject 1 each into the skin once.   Yes Historical Provider, MD  ibuprofen (ADVIL,MOTRIN) 200 MG tablet Take 200 mg by mouth every 6 (six) hours as needed for fever.   Yes Historical  Provider, MD  acetaminophen (TYLENOL) 500 MG tablet Take 1,000 mg by mouth every 6 (six) hours as needed for pain.    Historical Provider, MD  Multiple Vitamins-Minerals (MULTIVITAMIN PO) Take 1 tablet by mouth daily.    Historical Provider, MD   ED Triage Vitals  Enc Vitals Group     BP 08/21/14 2229 123/71 mmHg     Pulse Rate 08/21/14 2229 106     Resp 08/21/14 2229 16     Temp 08/21/14 2229 102.7 F (39.3 C)     Temp Source 08/21/14 2229 Oral     SpO2 08/21/14 2229 100 %     Weight 08/21/14 2229 220 lb (99.791 kg)     Height 08/21/14 2229  (1.702 m)     Head Cir --      Peak Flow --      Pain Score 08/21/14 2232 8     Pain Loc --      Pain Edu? --      Excl. in GC? --    Vital signs normal except for fever and tachycardia   Physical Exam  Constitutional: She is oriented to person, place, and time. She appears well-developed and well-nourished.  Non-toxic appearance. She does not appear ill. No distress.  HENT:  Head: Normocephalic and atraumatic.  Right Ear: External ear normal.  Left Ear: External ear normal.  Nose: Nose normal. No mucosal edema or rhinorrhea.  Mouth/Throat: Oropharynx  is clear and moist and mucous membranes are normal. No dental abscesses or uvula swelling.  Eyes: Conjunctivae and EOM are normal. Pupils are equal, round, and reactive to light.  Neck: Normal range of motion and full passive range of motion without pain. Neck supple.  Cardiovascular: Normal rate, regular rhythm and normal heart sounds.  Exam reveals no gallop and no friction rub.   No murmur heard. Pulmonary/Chest: Effort normal and breath sounds normal. No respiratory distress. She has no wheezes. She has no rhonchi. She has no rales. She exhibits no tenderness and no crepitus.  Abdominal: Soft. Normal appearance and bowel sounds are normal. She exhibits no distension. There is no tenderness. There is no rebound and no guarding.  Musculoskeletal: Normal range of motion. She exhibits no  edema or tenderness.  Moves all extremities well.   Neurological: She is alert and oriented to person, place, and time. She has normal strength. No cranial nerve deficit.  Skin: Skin is warm, dry and intact. No rash noted. No erythema. No pallor.  Psychiatric: She has a normal mood and affect. Her speech is normal and behavior is normal. Her mood appears not anxious.  Nursing note and vitals reviewed.   ED Course  Procedures (including critical care time)  Medications  0.9 %  sodium chloride infusion (0 mLs Intravenous Stopped 08/22/14 0340)    Followed by  0.9 %  sodium chloride infusion (0 mLs Intravenous Stopped 08/22/14 0340)    Followed by  0.9 %  sodium chloride infusion (1,000 mLs Intravenous New Bag/Given 08/22/14 0347)  piperacillin-tazobactam (ZOSYN) IVPB 3.375 g (3.375 g Intravenous New Bag/Given 08/22/14 0604)  acetaminophen (TYLENOL) tablet 650 mg (650 mg Oral Given 08/21/14 2240)  cefTRIAXone (ROCEPHIN) 2 g in dextrose 5 % 50 mL IVPB (0 g Intravenous Stopped 08/22/14 0546)  ibuprofen (ADVIL,MOTRIN) tablet 800 mg (800 mg Oral Given 08/22/14 0224)    DIAGNOSTIC STUDIES: Oxygen Saturation is 99% on room air, normal by my interpretation.    COORDINATION OF CARE: 1:29 AM Discussed treatment plan with patient at beside, the patient agrees with the plan and has no further questions at this time. Patient was given 2 g IV Rocephin for presumed pyelonephritis and possible early sepsis. Patient was given 2 L of IV fluid.  Patient was given her test results. She is agreeable to being admitted.  05:19 Dr Toniann Fail, wants to ad zosyn to her antibiotics. Wants me to talk to urology (should she stat at [facility] or go to [facility]) , admit to stepdown.  05:29 Dr Annabell Howells, advises to get bladder scan and place foley. He will see later this morning. Feels without a renal stone causing obstruction, she does not need to be transferred to [facility].   05:35 Dr Toniann Fail given update on Dr Belva Crome recommendations.    Patient's blood pressures in the 90s. Her heart rate has improved into the 90s. She was given a third liter of IV fluids.  Bladder scan shows 414 mL of urine.   Labs Review Results for orders placed or performed during the hospital encounter of 08/22/14  CBC with Differential  Result Value Ref Range   WBC 21.7 (H) 4.0 - 10.5 K/uL   RBC 4.07 3.87 - 5.11 MIL/uL   Hemoglobin 13.0 12.0 - 15.0 g/dL   HCT 40.9 81.1 - 91.4 %   MCV 89.2 78.0 - 100.0 fL   MCH 31.9 26.0 - 34.0 pg   MCHC 35.8 30.0 - 36.0 g/dL   RDW 78.2 95.6 -  15.5 %   Platelets 280 150 - 400 K/uL   Neutrophils Relative % 77 43 - 77 %   Lymphocytes Relative 10 (L) 12 - 46 %   Monocytes Relative 13 (H) 3 - 12 %   Eosinophils Relative 0 0 - 5 %   Basophils Relative 0 0 - 1 %   Neutro Abs 16.7 (H) 1.7 - 7.7 K/uL   Lymphs Abs 2.2 0.7 - 4.0 K/uL   Monocytes Absolute 2.8 (H) 0.1 - 1.0 K/uL   Eosinophils Absolute 0.0 0.0 - 0.7 K/uL   Basophils Absolute 0.0 0.0 - 0.1 K/uL   Smear Review MORPHOLOGY UNREMARKABLE   Comprehensive metabolic panel  Result Value Ref Range   Sodium 131 (L) 135 - 145 mmol/L   Potassium 3.2 (L) 3.5 - 5.1 mmol/L   Chloride 99 96 - 112 mEq/L   CO2 23 19 - 32 mmol/L   Glucose, Bld 107 (H) 70 - 99 mg/dL   BUN 9 6 - 23 mg/dL   Creatinine, Ser 1.61 (H) 0.50 - 1.10 mg/dL   Calcium 8.8 8.4 - 09.6 mg/dL   Total Protein 7.2 6.0 - 8.3 g/dL   Albumin 3.7 3.5 - 5.2 g/dL   AST 26 0 - 37 U/L   ALT 21 0 - 35 U/L   Alkaline Phosphatase 63 39 - 117 U/L   Total Bilirubin 0.8 0.3 - 1.2 mg/dL   GFR calc non Af Amer 65 (L) >90 mL/min   GFR calc Af Amer 75 (L) >90 mL/min   Anion gap 9 5 - 15  Urinalysis, Routine w reflex microscopic  Result Value Ref Range   Color, Urine YELLOW YELLOW   APPearance CLOUDY (A) CLEAR   Specific Gravity, Urine 1.013 1.005 - 1.030   pH 6.0 5.0 - 8.0   Glucose, UA NEGATIVE NEGATIVE mg/dL   Hgb urine dipstick LARGE (A) NEGATIVE   Bilirubin Urine NEGATIVE NEGATIVE   Ketones, ur  NEGATIVE NEGATIVE mg/dL   Protein, ur 30 (A) NEGATIVE mg/dL   Urobilinogen, UA 1.0 0.0 - 1.0 mg/dL   Nitrite NEGATIVE NEGATIVE   Leukocytes, UA LARGE (A) NEGATIVE  Pregnancy, urine  Result Value Ref Range   Preg Test, Ur NEGATIVE NEGATIVE  Urine microscopic-add on  Result Value Ref Range   Squamous Epithelial / LPF RARE RARE   WBC, UA TOO NUMEROUS TO COUNT <3 WBC/hpf   RBC / HPF 7-10 <3 RBC/hpf   Bacteria, UA MANY (A) RARE   Urine-Other MUCOUS PRESENT   I-Stat CG4 Lactic Acid, ED  Result Value Ref Range   Lactic Acid, Venous 0.63 0.5 - 2.2 mmol/L    Laboratory interpretation all normal except urinary tract infection, leukocytosis    Imaging Review Ct Renal Stone Study  08/22/2014   CLINICAL DATA:  Generalized abdominal pain and mid back pain beginning 2 days ago. No history of of kidneys stones or hematuria.  EXAM: CT ABDOMEN AND PELVIS WITHOUT CONTRAST  TECHNIQUE: Multidetector CT imaging of the abdomen and pelvis was performed following the standard protocol without IV contrast.  COMPARISON:  None.  FINDINGS: Moderate respiratory motion degraded examination of the upper abdomen.  LUNG BASES: Included view of the lung bases are clear. The visualized heart and pericardium are unremarkable.  KIDNEYS/BLADDER: Respiratory motion degraded examination. Kidneys are orthotopic, demonstrating normal size and morphology. No possible mild bilateral hydronephrosis. No definite nephrolithiasis. Limited assessment for renal masses on this nonenhanced examination. The unopacified ureters are normal in course and caliber. Urinary  bladder is well distended with mild circumferential wall thickening.  SOLID ORGANS: The liver, spleen, gallbladder, pancreas and adrenal glands are unremarkable for this non-contrast examination.  GASTROINTESTINAL TRACT: The stomach, small and large bowel are normal in course and caliber without inflammatory changes, the sensitivity may be decreased by lack of enteric contrast.  Normal appendix.  PERITONEUM/RETROPERITONEUM: No intraperitoneal free fluid nor free air. Aortoiliac vessels are normal in course and caliber. No lymphadenopathy by CT size criteria. Internal reproductive organs are unremarkable.  SOFT TISSUES/ OSSEOUS STRUCTURES: Nonsuspicious. Small fat containing umbilical hernia.  IMPRESSION: Motion degraded examination. Suspected mild bilateral hydronephrosis. Mild urinary bladder wall thickening can be seen with cystitis. No definite urolithiasis.   Electronically Signed   By: Awilda Metroourtnay  Bloomer   On: 08/22/2014 02:11     EKG Interpretation None      MDM   Final diagnoses:  Fever  UTI (urinary tract infection)  Back pain  Pyelonephritis    CRITICAL CARE Performed by: Devoria AlbeKNAPP,Kirstine Jacquin L Total critical care time: 35 min Critical care time was exclusive of separately billable procedures and treating other patients. Critical care was necessary to treat or prevent imminent or life-threatening deterioration. Critical care was time spent personally by me on the following activities: development of treatment plan with patient and/or surrogate as well as nursing, discussions with consultants, evaluation of patient's response to treatment, examination of patient, obtaining history from patient or surrogate, ordering and performing treatments and interventions, ordering and review of laboratory studies, ordering and review of radiographic studies, pulse oximetry and re-evaluation of patient's condition.    I personally performed the services described in this documentation, which was scribed in my presence. The recorded information has been reviewed and considered.  Devoria AlbeIva Zalika Tieszen, MD, Armando GangFACEP   Ward GivensIva L Esmae Donathan, MD 08/22/14 [phone]

## 2014-08-22 NOTE — ED Notes (Addendum)
Bladder scan volume pre void 

## 2014-08-22 NOTE — H&P (Signed)
[facility] History and Physical  Lori SongLisa A Sison ZOX:[id number] DOB: 08/30/1980 DOA: 08/22/2014  Referring physician: ER physician. PCP: Per Patient No Pcp   Chief Complaint: Denies body ache and fever chills.  HPI: Lori Cross is a 34 y.o. female with no significant past medical history presents to the ER because of generalized body pain fever chills over the last 2 days. Patient also has been having mid back pain. In the ER patient UA shows features concerning for UTI. Patient had a CT abdomen and pelvis which shows possible bilateral hydronephrosis and on-call urologist Dr. Wilson SingerWren was consulted by ER physician and at this time as per the urologist patient may be having some reflux. Patient has been preeclamptic antibiotics for sepsis secondary to UTI and admitted for further management. Patient denies any chest pain or shortness of breath. Denies any productive cough headache or visual symptoms. Patient was initially tachycardic and labs revealed leukocytosis. Patient's tachycardia improved with IV fluid boluses.  Review of Systems: As presented in the history of presenting illness, rest negative.  Past Medical History  Diagnosis Date  . Medical history non-contributory    Past Surgical History  Procedure Laterality Date  . No past surgeries     Social History:  reports that she has never smoked. She does not have any smokeless tobacco history on file. She reports that she drinks alcohol. She reports that she does not use illicit drugs. Where does patient live home. Can patient participate in ADLs yes.  No Known Allergies  Family History: History reviewed. No pertinent family history.    Prior to Admission medications   Medication Sig Start Date End Date Taking? Authorizing Provider  etonogestrel (IMPLANON) 68 MG IMPL implant Inject 1 each into the skin once.   Yes Historical Provider, MD  ibuprofen (ADVIL,MOTRIN) 200 MG tablet Take 200 mg by mouth every 6 (six) hours as  needed for fever.   Yes Historical Provider, MD  acetaminophen (TYLENOL) 500 MG tablet Take 1,000 mg by mouth every 6 (six) hours as needed for pain.    Historical Provider, MD  Multiple Vitamins-Minerals (MULTIVITAMIN PO) Take 1 tablet by mouth daily.    Historical Provider, MD    Physical Exam: Filed Vitals:   08/22/14 0530 08/22/14 0600 08/22/14 0615 08/22/14 0630  BP: 108/68 108/60 108/46 111/55  Pulse: 93 97 98 97  Temp:      TempSrc:      Resp: 26 26 27 16   Height:      Weight:      SpO2: 98% 99% 98% 99%     General:  Well-developed and nourished.  Eyes: Anicteric no pallor.  ENT: No discharge from the ears eyes nose and mouth.  Neck: No mass felt.  Cardiovascular: S1 and S2 heard.  Respiratory: No rhonchi or crepitations.  Abdomen: Soft nontender bowel sounds present. No guarding or rigidity.  Skin: No rash.  Musculoskeletal: No edema.  Psychiatric: Appears normal.  Neurologic: Alert awake oriented to time place and person. Moves all extremities.  Labs on Admission:  Basic Metabolic Panel:  Recent Labs Lab 08/21/14 2234  NA 131*  K 3.2*  CL 99  CO2 23  GLUCOSE 107*  BUN 9  CREATININE 1.12*  CALCIUM 8.8   Liver Function Tests:  Recent Labs Lab 08/21/14 2234  AST 26  ALT 21  ALKPHOS 63  BILITOT 0.8  PROT 7.2  ALBUMIN 3.7   No results for input(s): LIPASE, AMYLASE in the last 168 hours. No  results for input(s): AMMONIA in the last 168 hours. CBC:  Recent Labs Lab 08/21/14 2234  WBC 21.7*  NEUTROABS 16.7*  HGB 13.0  HCT 36.3  MCV 89.2  PLT 280   Cardiac Enzymes: No results for input(s): CKTOTAL, CKMB, CKMBINDEX, TROPONINI in the last 168 hours.  BNP (last 3 results) No results for input(s): PROBNP in the last 8760 hours. CBG: No results for input(s): GLUCAP in the last 168 hours.  Radiological Exams on Admission: Ct Renal Stone Study  08/22/2014   CLINICAL DATA:  Generalized abdominal pain and mid back pain beginning 2 days  ago. No history of of kidneys stones or hematuria.  EXAM: CT ABDOMEN AND PELVIS WITHOUT CONTRAST  TECHNIQUE: Multidetector CT imaging of the abdomen and pelvis was performed following the standard protocol without IV contrast.  COMPARISON:  None.  FINDINGS: Moderate respiratory motion degraded examination of the upper abdomen.  LUNG BASES: Included view of the lung bases are clear. The visualized heart and pericardium are unremarkable.  KIDNEYS/BLADDER: Respiratory motion degraded examination. Kidneys are orthotopic, demonstrating normal size and morphology. No possible mild bilateral hydronephrosis. No definite nephrolithiasis. Limited assessment for renal masses on this nonenhanced examination. The unopacified ureters are normal in course and caliber. Urinary bladder is well distended with mild circumferential wall thickening.  SOLID ORGANS: The liver, spleen, gallbladder, pancreas and adrenal glands are unremarkable for this non-contrast examination.  GASTROINTESTINAL TRACT: The stomach, small and large bowel are normal in course and caliber without inflammatory changes, the sensitivity may be decreased by lack of enteric contrast. Normal appendix.  PERITONEUM/RETROPERITONEUM: No intraperitoneal free fluid nor free air. Aortoiliac vessels are normal in course and caliber. No lymphadenopathy by CT size criteria. Internal reproductive organs are unremarkable.  SOFT TISSUES/ OSSEOUS STRUCTURES: Nonsuspicious. Small fat containing umbilical hernia.  IMPRESSION: Motion degraded examination. Suspected mild bilateral hydronephrosis. Mild urinary bladder wall thickening can be seen with cystitis. No definite urolithiasis.   Electronically Signed   By: Awilda Metro   On: 08/22/2014 02:11     Assessment/Plan Principal Problem:   Sepsis Active Problems:   Pyelonephritis   UTI (lower urinary tract infection)   1. Sepsis secondary to UTI - patient has been placed on Zosyn for complicated UTI. Follow urine  cultures. Continue with IV fluids. 2. Possible mild bilateral hydronephrosis - urologist Dr. Wilson Singer has been consulted and at this time Dr. Wilson Singer feels that patient may be having reflux and not hydronephrosis. Further recommendations per urology. 3. Acute renal failure - recheck metabolic panel after hydration.   DVT Prophylaxis SCDs. May change to Lovenox if no procedure anticipated.  Code Status: Full code.  Family Communication: None.  Disposition Plan: Admit to inpatient.    Cayman Kielbasa N. [facility] Pager [phone].  If 7PM-7AM, please contact night-coverage [email or web address] Password [facility] 08/22/2014, 6:38 AM

## 2014-08-22 NOTE — Progress Notes (Signed)
      Dr. Lynelle DoctorKnapp called me about the CT findings on Lori Cross.   She was noted to have possible mild bilateral hydronephrosis and bladder distention.   A prevoid bladder scan was 414ml but the post void was 15ml.         I am underwhelmed by the CT findings and don't think she needs a urology consult at this time.  It might be worthwhile to get a renal US in 2-3 days to reassess the kidneys.   Please call again if needed.

## 2014-08-22 NOTE — ED Notes (Signed)
Post void residual 15ml.

## 2014-08-23 DIAGNOSIS — N12 Tubulo-interstitial nephritis, not specified as acute or chronic: Secondary | ICD-10-CM

## 2014-08-23 DIAGNOSIS — E669 Obesity, unspecified: Secondary | ICD-10-CM | POA: Diagnosis present

## 2014-08-23 LAB — BASIC METABOLIC PANEL
ANION GAP: 8 (ref 5–15)
BUN: 6 mg/dL (ref 6–23)
CALCIUM: 8.1 mg/dL — AB (ref 8.4–10.5)
CO2: 21 mmol/L (ref 19–32)
CREATININE: 0.93 mg/dL (ref 0.50–1.10)
Chloride: 107 mEq/L (ref 96–112)
GFR calc non Af Amer: 81 mL/min — ABNORMAL LOW (ref >90)
GLUCOSE: 108 mg/dL — AB (ref 70–99)
Potassium: 3.7 mmol/L (ref 3.5–5.1)
SODIUM: 136 mmol/L (ref 135–145)

## 2014-08-23 LAB — CBC
HCT: 31.1 % — ABNORMAL LOW (ref 36.0–46.0)
Hemoglobin: 10.7 g/dL — ABNORMAL LOW (ref 12.0–15.0)
MCH: 30 pg (ref 26.0–34.0)
MCHC: 34.4 g/dL (ref 30.0–36.0)
MCV: 87.1 fL (ref 78.0–100.0)
Platelets: 223 10*3/uL (ref 150–400)
RBC: 3.57 MIL/uL — ABNORMAL LOW (ref 3.87–5.11)
RDW: 13 % (ref 11.5–15.5)
WBC: 18.3 10*3/uL — AB (ref 4.0–10.5)

## 2014-08-23 MED ORDER — VANCOMYCIN HCL 10 G IV SOLR
2500.0000 mg | Freq: Once | INTRAVENOUS | Status: AC
  Administered 2014-08-23: 2500 mg via INTRAVENOUS
  Filled 2014-08-23: qty 2500

## 2014-08-23 MED ORDER — VANCOMYCIN HCL IN DEXTROSE 1-5 GM/200ML-% IV SOLN
1000.0000 mg | Freq: Three times a day (TID) | INTRAVENOUS | Status: DC
  Administered 2014-08-23 – 2014-08-24 (×4): 1000 mg via INTRAVENOUS
  Filled 2014-08-23 (×5): qty 200

## 2014-08-23 NOTE — Progress Notes (Signed)
[facility]  Paged MD on call with results

## 2014-08-23 NOTE — Progress Notes (Signed)
PROGRESS NOTE  Jarold SongLisa A Cross ZOX:[id number] DOB: 10/29/1980 DOA: 08/22/2014 PCP: Per Patient No Pcp  HPI/Recap of past 4324 hours: 34 year old female patient with no significant past medical history presented with 2 days history of fevers, chills, generalized RD aches, urinary frequency and discomfort towards the end of micturition. She did have mild pain in the right lower back. In the ED, UA suggestive of UTI. CT abdomen and pelvis showed possible mild bilateral hydronephrosis. She was admitted for sepsis related to UTI. Patient continues to have fever, minimal improvement in white count. Overnight blood culture started to grow gram-positive cocci and vancomycin started.  Patient is of clinically feels better. She's had no fevers and the last 8 hours  Assessment/Plan: Principal Problem:   Sepsis: Patient growing out gram-positive cocci and so started vancomycin. An concerned that if this is staph, need to worry about epidural abscess. Patient however looks to clinically well for that. Possible contaminant with persistent fevers may be seen sometimes in pyelonephritis Active Problems:   Pyelonephritis: Continue IV Zosyn, awaiting cultures   UTI (lower urinary tract infection): Looks to be Escherichia coli   Code Status: Full code  Family Communication: Left message with family  Disposition Plan: Home once we determine infection is being fully treated, leukocytosis resolved   Consultants:  Urology  Procedures:  None  Antibiotics:  IV Zosyn 1/5-present   Objective: BP 104/58 mmHg  Pulse 96  Temp(Src) 99.4 F (37.4 C) (Oral)  Resp 20  Ht 5\' 7"  (1.702 m)  Wt 103.3 kg (227 lb 11.8 oz)  BMI 35.66 kg/m2  SpO2 92%  Intake/Output Summary (Last 24 hours) at 08/23/14 1931 Last data filed at 08/23/14 1416  Gross per 24 hour  Intake      0 ml  Output   1300 ml  Net  -1300 ml   Filed Weights   08/21/14 2229 08/22/14 0636  Weight: 99.791 kg (220 lb) 103.3 kg (227 lb 11.8  oz)    Exam:   General:  Alert and oriented 3, no acute distress  Cardiovascular: RRRS1, S2  Respiratory: CTA Bilaterally  Abdomen: Soft, NT, ND, +BS  Musculoskeletal: No clubbing or cyanosis, or edema   Data Reviewed: Basic Metabolic Panel:  Recent Labs Lab 08/21/14 2234 08/22/14 0755 08/23/14 0434  NA 131* 136 136  K 3.2* 3.3* 3.7  CL 99 105 107  CO2 23 25 21   GLUCOSE 107* 118* 108*  BUN 9 8 6   CREATININE 1.12* 1.01 0.93  CALCIUM 8.8 7.9* 8.1*   Liver Function Tests:  Recent Labs Lab 08/21/14 2234 08/22/14 0755  AST 26 21  ALT 21 18  ALKPHOS 63 51  BILITOT 0.8 1.0  PROT 7.2 5.8*  ALBUMIN 3.7 2.8*   No results for input(s): LIPASE, AMYLASE in the last 168 hours. No results for input(s): AMMONIA in the last 168 hours. CBC:  Recent Labs Lab 08/21/14 2234 08/22/14 0755 08/23/14 0434  WBC 21.7* 19.7* 18.3*  NEUTROABS 16.7* 15.6*  --   HGB 13.0 11.2* 10.7*  HCT 36.3 32.5* 31.1*  MCV 89.2 87.6 87.1  PLT 280 225 223   Cardiac Enzymes:   No results for input(s): CKTOTAL, CKMB, CKMBINDEX, TROPONINI in the last 168 hours. BNP (last 3 results) No results for input(s): PROBNP in the last 8760 hours. CBG: No results for input(s): GLUCAP in the last 168 hours.  Recent Results (from the past 240 hour(s))  Urine culture     Status: None (Preliminary result)  Collection Time: 08/21/14 10:40 PM  Result Value Ref Range Status   Specimen Description URINE, RANDOM  Final   Special Requests ADDED 0139 08/22/14  Final   Colony Count   Final    >=100,000 COLONIES/ML Performed at [facility]    Culture   Final    ESCHERICHIA COLI Performed at [facility]    Report Status PENDING  Incomplete  Blood culture (routine x 2)     Status: None (Preliminary result)   Collection Time: 08/22/14  1:35 AM  Result Value Ref Range Status   Specimen Description BLOOD RIGHT ANTECUBITAL  Final   Special Requests BOTTLES DRAWN AEROBIC AND ANAEROBIC 5CC  EACH  Final   Culture   Final    GRAM POSITIVE COCCI IN CLUSTERS Note: Gram Stain Report Called to,Read Back By and Verified With: STEPHANIE TUTTLE 08/23/14 AT 0405 RIDK Performed at [facility]    Report Status PENDING  Incomplete  Blood culture (routine x 2)     Status: None (Preliminary result)   Collection Time: 08/22/14  1:40 AM  Result Value Ref Range Status   Specimen Description BLOOD LEFT ANTECUBITAL  Final   Special Requests BOTTLES DRAWN AEROBIC AND ANAEROBIC 5CC EACH  Final   Culture   Final           BLOOD CULTURE RECEIVED NO GROWTH TO DATE CULTURE WILL BE HELD FOR 5 DAYS BEFORE ISSUING A FINAL NEGATIVE REPORT Performed at [facility]    Report Status PENDING  Incomplete     Studies: Ct Renal Stone Study  08/22/2014   CLINICAL DATA:  Generalized abdominal pain and mid back pain beginning 2 days ago. No history of of kidneys stones or hematuria.  EXAM: CT ABDOMEN AND PELVIS WITHOUT CONTRAST  TECHNIQUE: Multidetector CT imaging of the abdomen and pelvis was performed following the standard protocol without IV contrast.  COMPARISON:  None.  FINDINGS: Moderate respiratory motion degraded examination of the upper abdomen.  LUNG BASES: Included view of the lung bases are clear. The visualized heart and pericardium are unremarkable.  KIDNEYS/BLADDER: Respiratory motion degraded examination. Kidneys are orthotopic, demonstrating normal size and morphology. No possible mild bilateral hydronephrosis. No definite nephrolithiasis. Limited assessment for renal masses on this nonenhanced examination. The unopacified ureters are normal in course and caliber. Urinary bladder is well distended with mild circumferential wall thickening.  SOLID ORGANS: The liver, spleen, gallbladder, pancreas and adrenal glands are unremarkable for this non-contrast examination.  GASTROINTESTINAL TRACT: The stomach, small and large bowel are normal in course and caliber without inflammatory changes, the  sensitivity may be decreased by lack of enteric contrast. Normal appendix.  PERITONEUM/RETROPERITONEUM: No intraperitoneal free fluid nor free air. Aortoiliac vessels are normal in course and caliber. No lymphadenopathy by CT size criteria. Internal reproductive organs are unremarkable.  SOFT TISSUES/ OSSEOUS STRUCTURES: Nonsuspicious. Small fat containing umbilical hernia.  IMPRESSION: Motion degraded examination. Suspected mild bilateral hydronephrosis. Mild urinary bladder wall thickening can be seen with cystitis. No definite urolithiasis.   Electronically Signed   By: Awilda Metro   On: 08/22/2014 02:11    Scheduled Meds: . piperacillin-tazobactam (ZOSYN)  IV  3.375 g Intravenous 3 times per day  . vancomycin  1,000 mg Intravenous Q8H    Continuous Infusions:    Time spent: 25 minutes  Hollice Espy  [facility] Pager [phone]. If 7PM-7AM, please contact night-coverage at [email or web address], password [facility] 08/23/2014, 7:31 PM  LOS: 1 day

## 2014-08-23 NOTE — Progress Notes (Signed)
ANTIBIOTIC CONSULT NOTE - Follow Up  Pharmacy Consult for Zosyn, Adding Vancomycin  Indication: Intra-abdominal infection; bacteremia   No Known Allergies Patient Measurements: Height: 5\' 7"  (170.2 cm) Weight: 227 lb 11.8 oz (103.3 kg) IBW/kg (Calculated) : 61.6 Vital Signs: Temp: 98.2 F (36.8 C) (01/06 0119) Temp Source: Oral (01/06 0119) BP: 107/70 mmHg (01/06 0119) Pulse Rate: 85 (01/06 0119) Intake/Output from previous day: 01/05 0701 - 01/06 0700 In: -  Out: 600 [Urine:600] Labs:  Recent Labs  08/21/14 2234 08/22/14 0755  WBC 21.7* 19.7*  HGB 13.0 11.2*  PLT 280 225  CREATININE 1.12* 1.01   Estimated Creatinine Clearance: 99.8 mL/min (by C-G formula based on Cr of 1.01). No results for input(s): VANCOTROUGH, VANCOPEAK, VANCORANDOM, GENTTROUGH, GENTPEAK, GENTRANDOM, TOBRATROUGH, TOBRAPEAK, TOBRARND, AMIKACINPEAK, AMIKACINTROU, AMIKACIN in the last 72 hours.   Microbiology: Recent Results (from the past 720 hour(s))  Blood culture (routine x 2)     Status: None (Preliminary result)   Collection Time: 08/22/14  1:35 AM  Result Value Ref Range Status   Specimen Description BLOOD RIGHT ANTECUBITAL  Final   Special Requests BOTTLES DRAWN AEROBIC AND ANAEROBIC 5CC EACH  Final   Culture   Final    GRAM POSITIVE COCCI IN CLUSTERS Note: Gram Stain Report Called to,Read Back By and Verified With: STEPHANIE TUTTLE 08/23/14 AT 0405 RIDK Performed at [facility]    Report Status PENDING  Incomplete    Medical History: Past Medical History  Diagnosis Date  . Medical history non-contributory     Medications:  Anti-infectives    Start     Dose/Rate Route Frequency Ordered Stop   08/22/14 0715  piperacillin-tazobactam (ZOSYN) IVPB 3.375 g     3.375 g12.5 mL/hr over 240 Minutes Intravenous 3 times per day 08/22/14 0702     08/22/14 0545  piperacillin-tazobactam (ZOSYN) IVPB 3.375 g     3.375 g100 mL/hr over 30 Minutes Intravenous  Once 08/22/14 0531 08/22/14  0634   08/22/14 0130  cefTRIAXone (ROCEPHIN) 2 g in dextrose 5 % 50 mL IVPB     2 g100 mL/hr over 30 Minutes Intravenous  Once 08/22/14 0126 08/22/14 0546     Assessment: 34 year old female on Zosyn for r/o intra-abdominal infection. Blood cx x1 now positive for gram positive cocci. Pharmacy consulted to add Vancomycin for empiric coverage.  WBC 21.7 >>19.7, Tmax 102.4. CrCl 95-100 mL/min.   Goal of Therapy:  Clinical resolution of infection  Plan:  Zosyn 3.375g IV q8h - 4 hr infusion.  Give Vancomycin 2500 mg x 1 dose thenStart Vancomycin 1 gm IV Q8 hours  Monitor CBC, renal fx, cultures and [facility]   Vinnie LevelBenjamin Muna Demers, PharmD., BCPS Clinical Pharmacist Pager [phone]

## 2014-08-24 LAB — URINE CULTURE: Colony Count: >=100000

## 2014-08-24 LAB — CULTURE, BLOOD (ROUTINE X 2)

## 2014-08-24 LAB — BASIC METABOLIC PANEL
Anion gap: 9 (ref 5–15)
BUN: 6 mg/dL (ref 6–23)
CALCIUM: 8.2 mg/dL — AB (ref 8.4–10.5)
CO2: 21 mmol/L (ref 19–32)
CREATININE: 1.2 mg/dL — AB (ref 0.50–1.10)
Chloride: 105 mEq/L (ref 96–112)
GFR calc Af Amer: 69 mL/min — ABNORMAL LOW (ref >90)
GFR calc non Af Amer: 60 mL/min — ABNORMAL LOW (ref >90)
Glucose, Bld: 122 mg/dL — ABNORMAL HIGH (ref 70–99)
Potassium: 3.7 mmol/L (ref 3.5–5.1)
Sodium: 135 mmol/L (ref 135–145)

## 2014-08-24 LAB — CBC
HEMATOCRIT: 28.6 % — AB (ref 36.0–46.0)
Hemoglobin: 10.1 g/dL — ABNORMAL LOW (ref 12.0–15.0)
MCH: 30.6 pg (ref 26.0–34.0)
MCHC: 35.3 g/dL (ref 30.0–36.0)
MCV: 86.7 fL (ref 78.0–100.0)
Platelets: 223 10*3/uL (ref 150–400)
RBC: 3.3 MIL/uL — ABNORMAL LOW (ref 3.87–5.11)
RDW: 13.1 % (ref 11.5–15.5)
WBC: 13.9 10*3/uL — AB (ref 4.0–10.5)

## 2014-08-24 MED ORDER — CIPROFLOXACIN IN D5W 400 MG/200ML IV SOLN
400.0000 mg | Freq: Two times a day (BID) | INTRAVENOUS | Status: DC
  Administered 2014-08-24 – 2014-08-25 (×2): 400 mg via INTRAVENOUS
  Filled 2014-08-24 (×2): qty 200

## 2014-08-24 MED ORDER — DOCUSATE SODIUM 100 MG PO CAPS
200.0000 mg | ORAL_CAPSULE | Freq: Two times a day (BID) | ORAL | Status: DC
Start: 2014-08-24 — End: 2014-08-25
  Administered 2014-08-24 – 2014-08-25 (×2): 200 mg via ORAL
  Filled 2014-08-24 (×2): qty 2

## 2014-08-24 MED ORDER — ALUM & MAG HYDROXIDE-SIMETH 200-200-20 MG/5ML PO SUSP
30.0000 mL | Freq: Four times a day (QID) | ORAL | Status: DC | PRN
  Administered 2014-08-24: 30 mL via ORAL
  Filled 2014-08-24: qty 30

## 2014-08-24 MED ORDER — BISACODYL 5 MG PO TBEC
10.0000 mg | DELAYED_RELEASE_TABLET | Freq: Once | ORAL | Status: AC
  Administered 2014-08-24: 10 mg via ORAL
  Filled 2014-08-24 (×2): qty 2

## 2014-08-24 MED ORDER — ENOXAPARIN SODIUM 40 MG/0.4ML ~~LOC~~ SOLN
40.0000 mg | SUBCUTANEOUS | Status: DC
  Administered 2014-08-24: 40 mg via SUBCUTANEOUS
  Filled 2014-08-24: qty 0.4

## 2014-08-24 NOTE — Progress Notes (Signed)
PROGRESS NOTE  Lori Cross ZOX:[id number] DOB: 1980/10/01 DOA: 08/22/2014 PCP: Per Patient No Pcp  HPI/Recap of past 59 hours: 34 year old female patient with no significant past medical history presented with 2 days history of fevers, chills, generalized RD aches, urinary frequency and discomfort towards the end of micturition. She did have mild pain in the right lower back. In the ED, UA suggestive of UTI. CT abdomen and pelvis showed possible mild bilateral hydronephrosis. She was admitted for sepsis related to UTI. Patient continues to have fever, minimal improvement in white count. Overnight blood culture started to grow gram-positive cocci and vancomycin started.  Patient herself has continued to remain stable and clinically improved. White blood cell count continues to come down. Blood cultures positive for coag negative staph in 1 bottle, felt to be contaminant. Urine grew out pansensitive Escherichia coli. Patient herself with no complaints, did have fever last night of 103  Assessment/Plan: Principal Problem: Positive blood culture: Likely contaminant Active Problems:   Pyelonephritis: Changed to IV Cipro, residual fever likely from this   UTI (lower urinary tract infection): Looks to be Escherichia coli   Code Status: Full code  Family Communication: Left message with family  Disposition Plan: Home likely tomorrow   Consultants:  Urology  Procedures:  None  Antibiotics:  IV Zosyn 1/5-1/7  IV Cipro 1/7-present (changed to by mouth once white blood cell count resolved and afebrile 24 hours)   Objective: BP 119/53 mmHg  Pulse 101  Temp(Src) 98.9 F (37.2 C) (Oral)  Resp 18  Ht  (1.702 m)  Wt 103.3 kg (227 lb 11.8 oz)  BMI 35.66 kg/m2  SpO2 100%  Intake/Output Summary (Last 24 hours) at 08/24/14 2011 Last data filed at 08/24/14 1405  Gross per 24 hour  Intake    610 ml  Output    700 ml  Net    -90 ml   Filed Weights   08/21/14 2229 08/22/14  0636  Weight: 99.791 kg (220 lb) 103.3 kg (227 lb 11.8 oz)    Exam:   General:  Alert and oriented 3, no acute distress  Cardiovascular: Regular rate and rhythm, S1-S2  Respiratory: Clear to auscultation bilaterally  Abdomen: Soft, NT, ND, +BS  Musculoskeletal: No clubbing or cyanosis, or edema   Data Reviewed: Basic Metabolic Panel:  Recent Labs Lab 08/21/14 2234 08/22/14 0755 08/23/14 0434 08/24/14 0544  NA 131* 136 136 135  K 3.2* 3.3* 3.7 3.7  CL 99 105 107 105  CO2 GLUCOSE 107* 118* 108* 122*  BUN CREATININE 1.12* 1.01 0.93 1.20*  CALCIUM 8.8 7.9* 8.1* 8.2*   Liver Function Tests:  Recent Labs Lab 08/21/14 2234 08/22/14 0755  AST 26 21  ALT 21 18  ALKPHOS 63 51  BILITOT 0.8 1.0  PROT 7.2 5.8*  ALBUMIN 3.7 2.8*   No results for input(s): LIPASE, AMYLASE in the last 168 hours. No results for input(s): AMMONIA in the last 168 hours. CBC:  Recent Labs Lab 08/21/14 2234 08/22/14 0755 08/23/14 0434 08/24/14 0544  WBC 21.7* 19.7* 18.3* 13.9*  NEUTROABS 16.7* 15.6*  --   --   HGB 13.0 11.2* 10.7* 10.1*  HCT 36.3 32.5* 31.1* 28.6*  MCV 89.2 87.6 87.1 86.7  PLT 280 225 223 223   Cardiac Enzymes:   No results for input(s): CKTOTAL, CKMB, CKMBINDEX, TROPONINI in the last 168 hours. BNP (last 3 results) No results for input(s): PROBNP in  the last 8760 hours. CBG: No results for input(s): GLUCAP in the last 168 hours.  Recent Results (from the past 240 hour(s))  Urine culture     Status: None   Collection Time: 08/21/14 10:40 PM  Result Value Ref Range Status   Specimen Description URINE, RANDOM  Final   Special Requests ADDED 0139 08/22/14  Final   Colony Count   Final    >=100,000 COLONIES/ML Performed at [facility]    Culture   Final    ESCHERICHIA COLI Performed at [facility]    Report Status 08/24/2014 FINAL  Final   Organism ID, Bacteria ESCHERICHIA COLI  Final      Susceptibility    Escherichia coli - MIC*    AMPICILLIN <=2 SENSITIVE Sensitive     CEFAZOLIN <=4 SENSITIVE Sensitive     CEFTRIAXONE <=1 SENSITIVE Sensitive     CIPROFLOXACIN <=0.25 SENSITIVE Sensitive     GENTAMICIN <=1 SENSITIVE Sensitive     LEVOFLOXACIN <=0.12 SENSITIVE Sensitive     NITROFURANTOIN <=16 SENSITIVE Sensitive     TOBRAMYCIN <=1 SENSITIVE Sensitive     TRIMETH/SULFA <=20 SENSITIVE Sensitive     PIP/TAZO <=4 SENSITIVE Sensitive     * ESCHERICHIA COLI  Blood culture (routine x 2)     Status: None   Collection Time: 08/22/14  1:35 AM  Result Value Ref Range Status   Specimen Description BLOOD RIGHT ANTECUBITAL  Final   Special Requests BOTTLES DRAWN AEROBIC AND ANAEROBIC 5CC EACH  Final   Culture   Final    STAPHYLOCOCCUS SPECIES (COAGULASE NEGATIVE) Note: THE SIGNIFICANCE OF ISOLATING THIS ORGANISM FROM A SINGLE SET OF BLOOD CULTURES WHEN MULTIPLE SETS ARE DRAWN IS UNCERTAIN. PLEASE NOTIFY THE MICROBIOLOGY DEPARTMENT WITHIN ONE WEEK IF SPECIATION AND SENSITIVITIES ARE REQUIRED. Note: Gram Stain Report Called to,Read Back By and Verified With: STEPHANIE TUTTLE 08/23/14 AT 0405 RIDK Performed at [facility]    Report Status 08/24/2014 FINAL  Final  Blood culture (routine x 2)     Status: None (Preliminary result)   Collection Time: 08/22/14  1:40 AM  Result Value Ref Range Status   Specimen Description BLOOD LEFT ANTECUBITAL  Final   Special Requests BOTTLES DRAWN AEROBIC AND ANAEROBIC 5CC EACH  Final   Culture   Final           BLOOD CULTURE RECEIVED NO GROWTH TO DATE CULTURE WILL BE HELD FOR 5 DAYS BEFORE ISSUING A FINAL NEGATIVE REPORT Performed at [facility]    Report Status PENDING  Incomplete     Studies: No results found.  Scheduled Meds: . ciprofloxacin  400 mg Intravenous Q12H  . enoxaparin (LOVENOX) injection  40 mg Subcutaneous Q24H    Continuous Infusions:    Time spent: 25 minutes  Hollice EspyKRISHNAN,Wood Novacek K  [facility] Pager [phone]. If  7PM-7AM, please contact night-coverage at [email or web address], password [email or web address] 08/24/2014, 8:11 PM  LOS: 2 days

## 2014-08-25 DIAGNOSIS — A4151 Sepsis due to Escherichia coli [E. coli]: Secondary | ICD-10-CM

## 2014-08-25 LAB — CBC
HCT: 29.6 % — ABNORMAL LOW (ref 36.0–46.0)
HEMOGLOBIN: 10.2 g/dL — AB (ref 12.0–15.0)
MCH: 29.6 pg (ref 26.0–34.0)
MCHC: 34.5 g/dL (ref 30.0–36.0)
MCV: 85.8 fL (ref 78.0–100.0)
Platelets: 275 10*3/uL (ref 150–400)
RBC: 3.45 MIL/uL — ABNORMAL LOW (ref 3.87–5.11)
RDW: 13 % (ref 11.5–15.5)
WBC: 10.7 10*3/uL — ABNORMAL HIGH (ref 4.0–10.5)

## 2014-08-25 LAB — BASIC METABOLIC PANEL
Anion gap: 7 (ref 5–15)
BUN: 8 mg/dL (ref 6–23)
CALCIUM: 8.5 mg/dL (ref 8.4–10.5)
CO2: 24 mmol/L (ref 19–32)
Chloride: 106 mEq/L (ref 96–112)
Creatinine, Ser: 1.42 mg/dL — ABNORMAL HIGH (ref 0.50–1.10)
GFR calc Af Amer: 56 mL/min — ABNORMAL LOW (ref >90)
GFR calc non Af Amer: 49 mL/min — ABNORMAL LOW (ref >90)
Glucose, Bld: 103 mg/dL — ABNORMAL HIGH (ref 70–99)
Potassium: 3.6 mmol/L (ref 3.5–5.1)
Sodium: 137 mmol/L (ref 135–145)

## 2014-08-25 MED ORDER — CIPROFLOXACIN HCL 500 MG PO TABS: 500.0000 mg | ORAL_TABLET | Freq: Two times a day (BID) | ORAL | Status: AC

## 2014-08-25 NOTE — Progress Notes (Signed)
Patient is discharged from room 4N23 at this time. Alert and in stable condition. IV site d/c'd. Instructions read to patient and understanding verbalized. Left unit via wheelchair with all belongings at side.

## 2014-08-25 NOTE — Progress Notes (Signed)
Pt vomited clear moderate amount. Zofran given. Will continue to monitor.

## 2014-08-25 NOTE — Discharge Instructions (Signed)
Pyelonephritis, Adult °Pyelonephritis is a kidney infection. A kidney infection can happen quickly, or it can last for a long time. °HOME CARE  °· Take your medicine (antibiotics) as told. Finish it even if you start to feel better. °· Keep all doctor visits as told. °· Drink enough fluids to keep your pee (urine) clear or pale yellow. °· Only take medicine as told by your doctor. °GET HELP RIGHT AWAY IF:  °· You have a fever or lasting symptoms for more than 2-3 days. °· You have a fever and your symptoms suddenly get worse. °· You cannot take your medicine or drink fluids as told. °· You have chills and shaking. °· You feel very weak or pass out (faint). °· You do not feel better after 2 days. °MAKE SURE YOU: °· Understand these instructions. °· Will watch your condition. °· Will get help right away if you are not doing well or get worse. °Document Released: 09/11/2004 Document Revised: 02/03/2012 Document Reviewed: 01/22/2011 °[employer]® Patient Information ©2015 [employer]. This information is not intended to replace advice given to you by your health care provider. Make sure you discuss any questions you have with your health care provider. ° °

## 2014-08-25 NOTE — Discharge Summary (Signed)
Discharge Summary  Lori Cross ZOX:[id number] DOB: 05-30-1981  PCP: Per Patient No Pcp  Admit date: 08/22/2014 Discharge date: 08/25/2014  Time spent: 25 min  Recommendations for Outpatient Follow-up:  1. New medication: Cipro 500 mg by mouth twice a day 10 days  Discharge Diagnoses:  Active Hospital Problems   Diagnosis Date Noted  . Sepsis 08/22/2014  . Obesity (BMI 30-39.9) 08/23/2014  . Pyelonephritis 08/22/2014  . UTI (lower urinary tract infection) 08/22/2014    Resolved Hospital Problems   Diagnosis Date Noted Date Resolved  No resolved problems to display.    Discharge Condition: Improved, being discharged home  Diet recommendation: Regular  Filed Weights   08/21/14 2229 08/22/14 0636 08/25/14 0500  Weight: 99.791 kg (220 lb) 103.3 kg (227 lb 11.8 oz) 105 kg (231 lb 7.7 oz)    History of present illness:  Patient is a 34 year old female with no past medical history who was admitted on 1/5 with 2 days of fevers, chills, aches and urinary frequency and found to have a large UTI. CT of abdomen and pelvis noted bilateral hydronephrosis. Patient also noted to have a white count of 18. Patient admitted for early sepsis from pyelonephritis.  Hospital Course:  Principal Problem:   Sepsis: Patient meets criteria with fever, leukocytosis and urinary source. Blood cultures done which initially grew out gram-positive cocci in one bottle noted to be coag negative staph which was felt to be contaminant. Patient had recently been put on vancomycin 1 day when this happened, but was discontinued when this was discovered to be contaminant. Active Problems:   Pyelonephritis: Urine positive for Escherichia coli. Initially on IV Zosyn than they're at 2 IV Rocephin and continued on until white blood cell count normalized 1/8. At that time patient no longer had any fever spikes. Felt medically stable and will be changed over to by mouth Cipro and continue for 10 more days for total of 14  days therapy    UTI (lower urinary tract infection): As above   Obesity (BMI 30-39.9): Patient is criteria with BMI greater than 30   Procedures: None  Consultations:  None   Discharge Exam: BP 114/81 mmHg  Pulse 74  Temp(Src) 98.8 F (37.1 C) (Oral)  Resp 18  Ht  (1.702 m)  Wt 105 kg (231 lb 7.7 oz)  BMI 36.25 kg/m2  SpO2 100%  General: Alert and oriented 3, no acute distress  Cardiovascular: regular rate and rhythm, S1 and S2  Respiratory: clear to auscultation bilaterally   Discharge Instructions You were cared for by a hospitalist during your hospital stay. If you have any questions about your discharge medications or the care you received while you were in the hospital after you are discharged, you can call the unit and asked to speak with the hospitalist on call if the hospitalist that took care of you is not available. Once you are discharged, your primary care physician will handle any further medical issues. Please note that NO REFILLS for any discharge medications will be authorized once you are discharged, as it is imperative that you return to your primary care physician (or establish a relationship with a primary care physician if you do not have one) for your aftercare needs so that they can reassess your need for medications and monitor your lab values.  Discharge Instructions    Diet general    Complete by:  As directed      Increase activity slowly    Complete by:  As directed             Medication List    STOP taking these medications        MULTIVITAMIN PO      TAKE these medications        acetaminophen 500 MG tablet  Commonly known as:  TYLENOL  Take 1,000 mg by mouth every 6 (six) hours as needed for pain.     ciprofloxacin 500 MG tablet  Commonly known as:  CIPRO  Take 1 tablet (500 mg total) by mouth 2 (two) times daily.     ibuprofen 200 MG tablet  Commonly known as:  ADVIL,MOTRIN  Take 200 mg by mouth every 6 (six) hours as  needed for fever.     IMPLANON 68 MG Impl implant  Generic drug:  etonogestrel  Inject 1 each into the skin once.       No Known Allergies    The results of significant diagnostics from this hospitalization (including imaging, microbiology, ancillary and laboratory) are listed below for reference.    Significant Diagnostic Studies: Ct Renal Stone Study  08/22/2014   CLINICAL DATA:  Generalized abdominal pain and mid back pain beginning 2 days ago. No history of of kidneys stones or hematuria.  EXAM: CT ABDOMEN AND PELVIS WITHOUT CONTRAST  TECHNIQUE: Multidetector CT imaging of the abdomen and pelvis was performed following the standard protocol without IV contrast.  COMPARISON:  None.  FINDINGS: Moderate respiratory motion degraded examination of the upper abdomen.  LUNG BASES: Included view of the lung bases are clear. The visualized heart and pericardium are unremarkable.  KIDNEYS/BLADDER: Respiratory motion degraded examination. Kidneys are orthotopic, demonstrating normal size and morphology. No possible mild bilateral hydronephrosis. No definite nephrolithiasis. Limited assessment for renal masses on this nonenhanced examination. The unopacified ureters are normal in course and caliber. Urinary bladder is well distended with mild circumferential wall thickening.  SOLID ORGANS: The liver, spleen, gallbladder, pancreas and adrenal glands are unremarkable for this non-contrast examination.  GASTROINTESTINAL TRACT: The stomach, small and large bowel are normal in course and caliber without inflammatory changes, the sensitivity may be decreased by lack of enteric contrast. Normal appendix.  PERITONEUM/RETROPERITONEUM: No intraperitoneal free fluid nor free air. Aortoiliac vessels are normal in course and caliber. No lymphadenopathy by CT size criteria. Internal reproductive organs are unremarkable.  SOFT TISSUES/ OSSEOUS STRUCTURES: Nonsuspicious. Small fat containing umbilical hernia.  IMPRESSION:  Motion degraded examination. Suspected mild bilateral hydronephrosis. Mild urinary bladder wall thickening can be seen with cystitis. No definite urolithiasis.   Electronically Signed   By: Awilda Metroourtnay  Bloomer   On: 08/22/2014 02:11    Microbiology: Recent Results (from the past 240 hour(s))  Urine culture     Status: None   Collection Time: 08/21/14 10:40 PM  Result Value Ref Range Status   Specimen Description URINE, RANDOM  Final   Special Requests ADDED 0139 08/22/14  Final   Colony Count   Final    >=100,000 COLONIES/ML Performed at [facility]    Culture   Final    ESCHERICHIA COLI Performed at [facility]    Report Status 08/24/2014 FINAL  Final   Organism ID, Bacteria ESCHERICHIA COLI  Final      Susceptibility   Escherichia coli - MIC*    AMPICILLIN <=2 SENSITIVE Sensitive     CEFAZOLIN <=4 SENSITIVE Sensitive     CEFTRIAXONE <=1 SENSITIVE Sensitive     CIPROFLOXACIN <=0.25 SENSITIVE Sensitive     GENTAMICIN <=  1 SENSITIVE Sensitive     LEVOFLOXACIN <=0.12 SENSITIVE Sensitive     NITROFURANTOIN <=16 SENSITIVE Sensitive     TOBRAMYCIN <=1 SENSITIVE Sensitive     TRIMETH/SULFA <=20 SENSITIVE Sensitive     PIP/TAZO <=4 SENSITIVE Sensitive     * ESCHERICHIA COLI  Blood culture (routine x 2)     Status: None   Collection Time: 08/22/14  1:35 AM  Result Value Ref Range Status   Specimen Description BLOOD RIGHT ANTECUBITAL  Final   Special Requests BOTTLES DRAWN AEROBIC AND ANAEROBIC 5CC EACH  Final   Culture   Final    STAPHYLOCOCCUS SPECIES (COAGULASE NEGATIVE) Note: THE SIGNIFICANCE OF ISOLATING THIS ORGANISM FROM A SINGLE SET OF BLOOD CULTURES WHEN MULTIPLE SETS ARE DRAWN IS UNCERTAIN. PLEASE NOTIFY THE MICROBIOLOGY DEPARTMENT WITHIN ONE WEEK IF SPECIATION AND SENSITIVITIES ARE REQUIRED. Note: Gram Stain Report Called to,Read Back By and Verified With: STEPHANIE TUTTLE 08/23/14 AT 0405 RIDK Performed at [facility]    Report Status 08/24/2014  FINAL  Final  Blood culture (routine x 2)     Status: None (Preliminary result)   Collection Time: 08/22/14  1:40 AM  Result Value Ref Range Status   Specimen Description BLOOD LEFT ANTECUBITAL  Final   Special Requests BOTTLES DRAWN AEROBIC AND ANAEROBIC 5CC EACH  Final   Culture   Final           BLOOD CULTURE RECEIVED NO GROWTH TO DATE CULTURE WILL BE HELD FOR 5 DAYS BEFORE ISSUING A FINAL NEGATIVE REPORT Performed at [facility]    Report Status PENDING  Incomplete     Labs: Basic Metabolic Panel:  Recent Labs Lab 08/21/14 2234 08/22/14 0755 08/23/14 0434 08/24/14 0544 08/25/14 0446  NA 131* 136 136 135 137  K 3.2* 3.3* 3.7 3.7 3.6  CL 99 105 107 105 106  CO2 GLUCOSE 107* 118* 108* 122* 103*  BUN CREATININE 1.12* 1.01 0.93 1.20* 1.42*  CALCIUM 8.8 7.9* 8.1* 8.2* 8.5   Liver Function Tests:  Recent Labs Lab 08/21/14 2234 08/22/14 0755  AST 26 21  ALT 21 18  ALKPHOS 63 51  BILITOT 0.8 1.0  PROT 7.2 5.8*  ALBUMIN 3.7 2.8*   No results for input(s): LIPASE, AMYLASE in the last 168 hours. No results for input(s): AMMONIA in the last 168 hours. CBC:  Recent Labs Lab 08/21/14 2234 08/22/14 0755 08/23/14 0434 08/24/14 0544 08/25/14 0446  WBC 21.7* 19.7* 18.3* 13.9* 10.7*  NEUTROABS 16.7* 15.6*  --   --   --   HGB 13.0 11.2* 10.7* 10.1* 10.2*  HCT 36.3 32.5* 31.1* 28.6* 29.6*  MCV 89.2 87.6 87.1 86.7 85.8  PLT 280 225 223 223 275   Cardiac Enzymes: No results for input(s): CKTOTAL, CKMB, CKMBINDEX, TROPONINI in the last 168 hours. BNP: BNP (last 3 results) No results for input(s): PROBNP in the last 8760 hours. CBG: No results for input(s): GLUCAP in the last 168 hours.     Signed:  Hollice Espy  [facility] 08/25/2014, 10:36 AM

## 2014-08-28 LAB — CULTURE, BLOOD (ROUTINE X 2): Culture: NO GROWTH

## 2016-01-25 IMAGING — CT CT RENAL STONE PROTOCOL
2 of 4 series · 16 of 46 positions shown, 18 images · non-contrast
Comparison: None.

CLINICAL DATA: Generalized abdominal pain and mid back pain
beginning 2 days ago. No history of of kidneys stones or hematuria.

EXAM:
CT ABDOMEN AND PELVIS WITHOUT CONTRAST
TECHNIQUE: Multidetector CT imaging of the abdomen and pelvis was performed
following the standard protocol without IV contrast.

[Series 2: stone study 5.0 i30f 1 · axial · 0.80mm/px · z∈[-568,-108]mm · 13 of 100 slices shown, 15 images]
[im 4/100  soft-tissue]
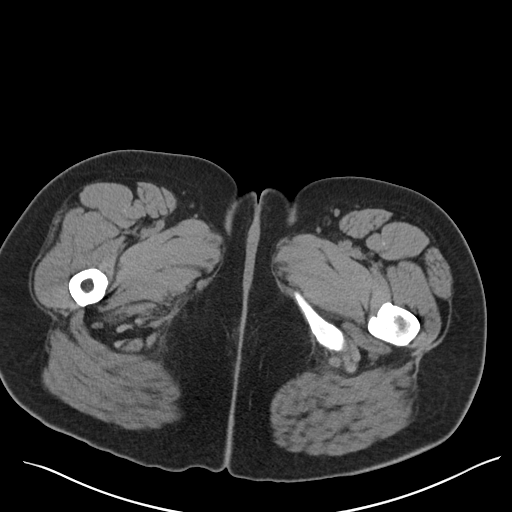
[im 4/100  bone]
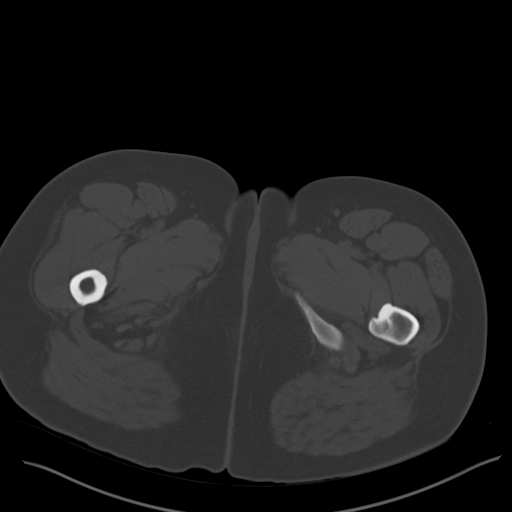
[im 12/100  soft-tissue]
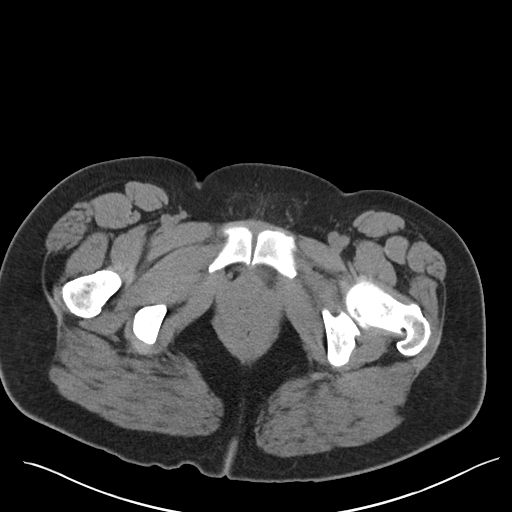
[im 20/100  soft-tissue]
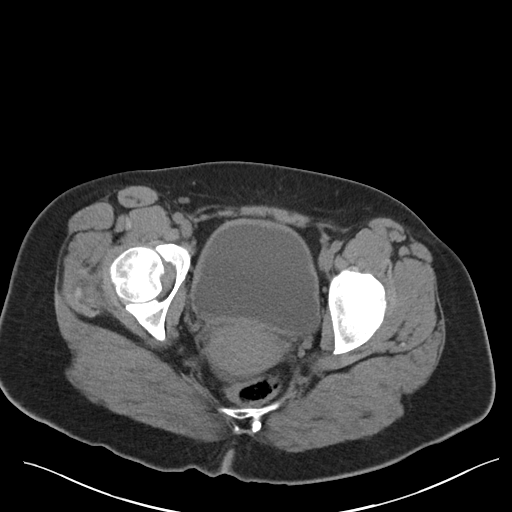
[im 28/100  soft-tissue]
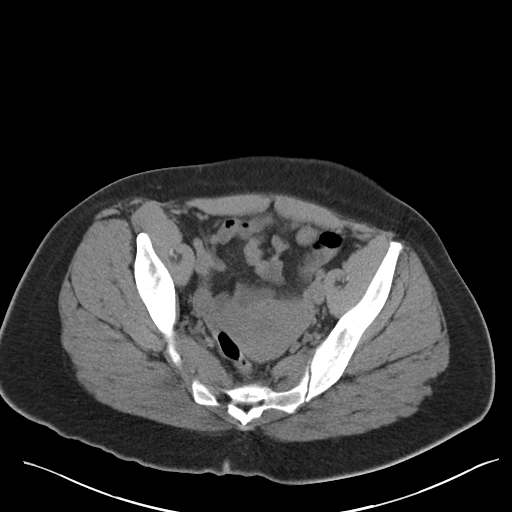
[im 36/100  soft-tissue]
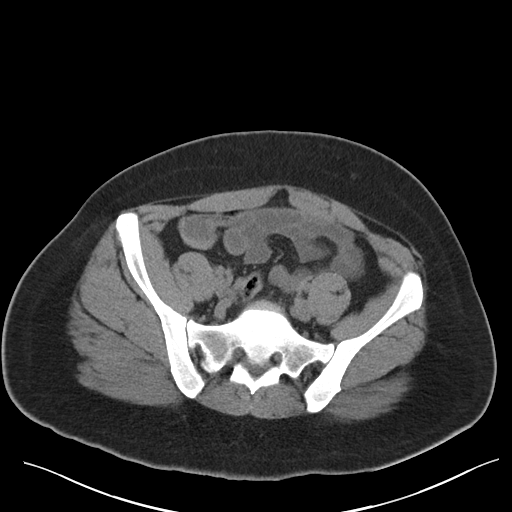
[im 44/100  soft-tissue]
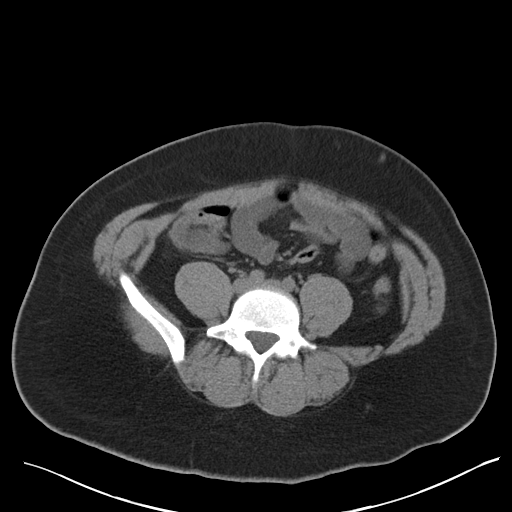
[im 52/100  soft-tissue]
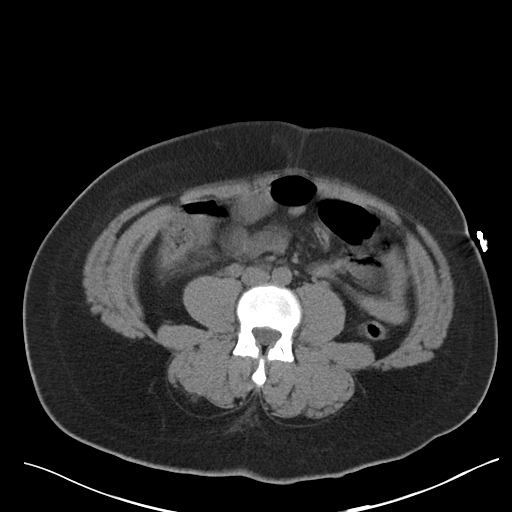
[im 56/100  soft-tissue]
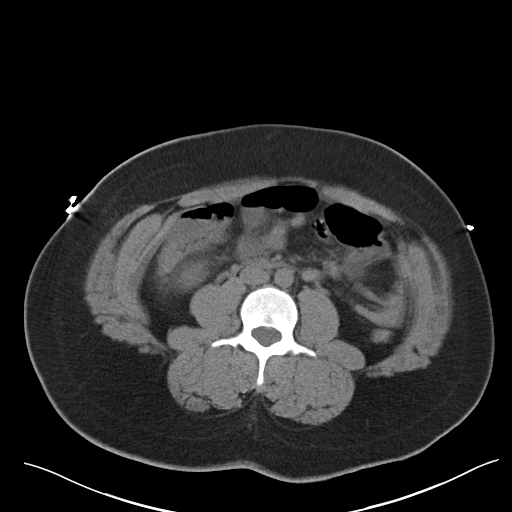
[im 64/100  soft-tissue]
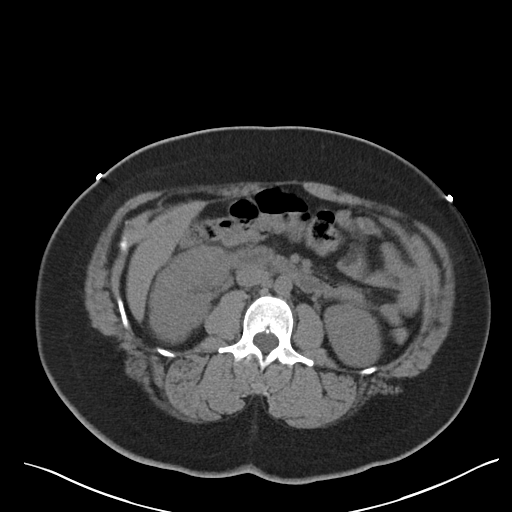
[im 64/100  bone]
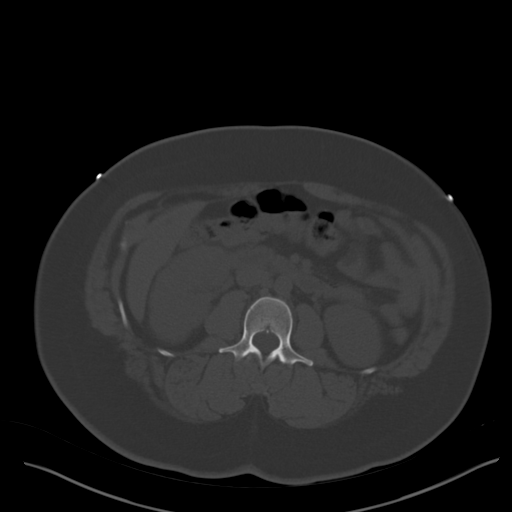
[im 72/100  soft-tissue]
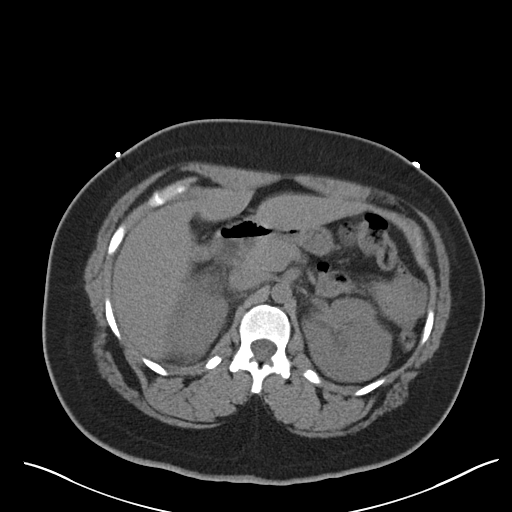
[im 80/100  soft-tissue]
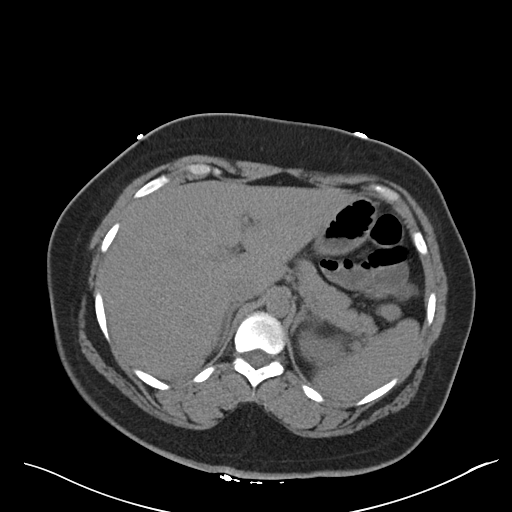
[im 88/100  soft-tissue]
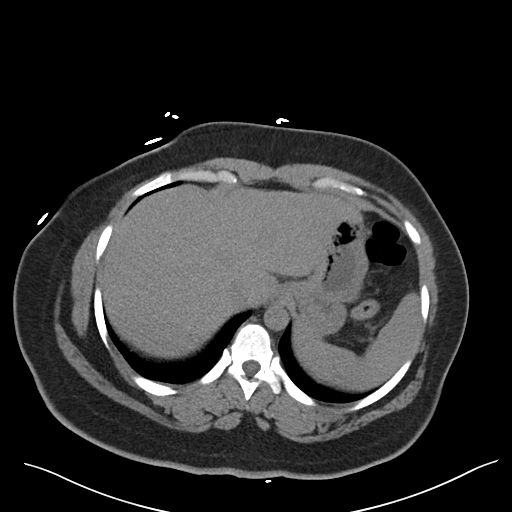
[im 96/100  soft-tissue]
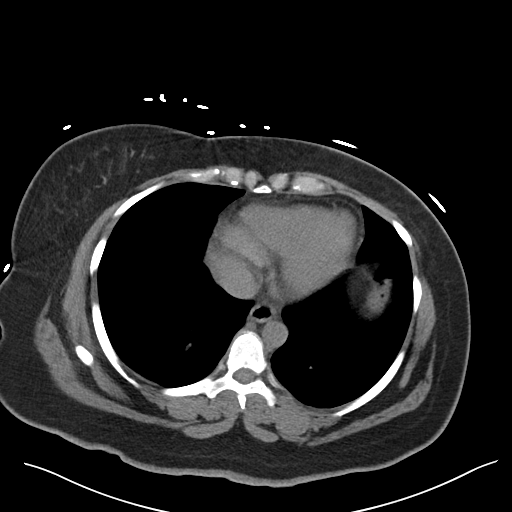

[Series 5: coronal soft tissue · coronal · 0.93mm/px · 3 of 93 slices shown]
[im 31/93  soft-tissue]
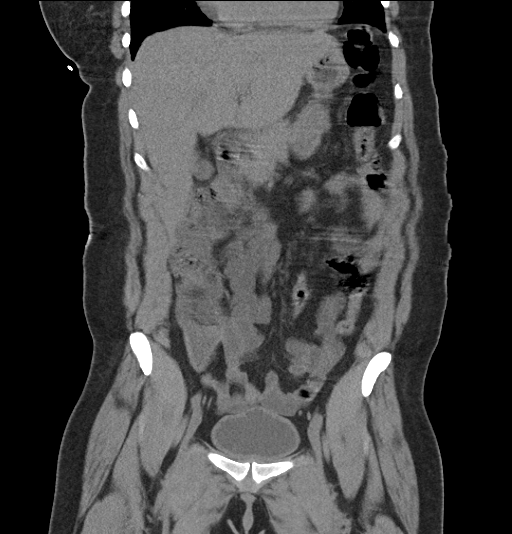
[im 41/93  soft-tissue]
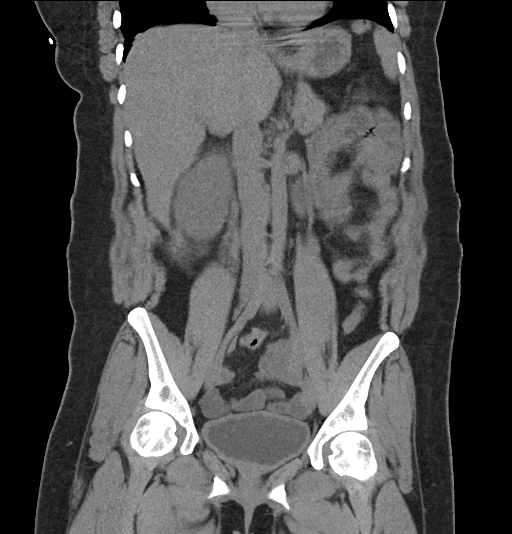
[im 52/93  soft-tissue]
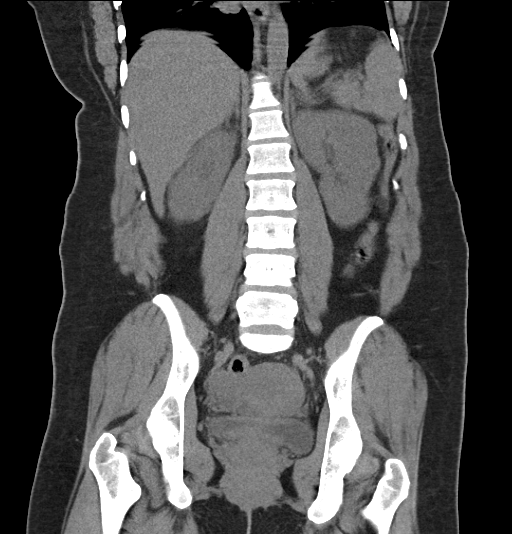

[16 of 46 positions shown; findings below may reference images not displayed]

FINDINGS: Moderate respiratory motion degraded examination of the upper
abdomen.

LUNG BASES: Included view of the lung bases are clear. The
visualized heart and pericardium are unremarkable.

KIDNEYS/BLADDER: Respiratory motion degraded examination. Kidneys
are orthotopic, demonstrating normal size and morphology. No
possible mild bilateral hydronephrosis. No definite nephrolithiasis.
Limited assessment for renal masses on this nonenhanced examination.
The unopacified ureters are normal in course and caliber. Urinary
bladder is well distended with mild circumferential wall thickening.

SOLID ORGANS: The liver, spleen, gallbladder, pancreas and adrenal
glands are unremarkable for this non-contrast examination.

GASTROINTESTINAL TRACT: The stomach, small and large bowel are
normal in course and caliber without inflammatory changes, the
sensitivity may be decreased by lack of enteric contrast. Normal
appendix.

PERITONEUM/RETROPERITONEUM: No intraperitoneal free fluid nor free
air. Aortoiliac vessels are normal in course and caliber. No
lymphadenopathy by CT size criteria. Internal reproductive organs
are unremarkable.

SOFT TISSUES/ OSSEOUS STRUCTURES: Nonsuspicious. Small fat
containing umbilical hernia.
IMPRESSION: Motion degraded examination. Suspected mild bilateral
hydronephrosis. Mild urinary bladder wall thickening can be seen
with cystitis. No definite urolithiasis.

  By: Asmus Hoogeveen

## 2019-10-17 ENCOUNTER — Other Ambulatory Visit: Payer: Self-pay

## 2019-10-17 ENCOUNTER — Encounter (HOSPITAL_COMMUNITY): Payer: Self-pay

## 2019-10-17 ENCOUNTER — Ambulatory Visit (HOSPITAL_COMMUNITY)
Admission: EM | Admit: 2019-10-17 | Discharge: 2019-10-17 | Disposition: A | Discharge status: Home / Self Care | Payer: BLUE CROSS/BLUE SHIELD | Attending: Family Medicine | Admitting: Family Medicine

## 2019-10-17 DIAGNOSIS — R300 Dysuria: Secondary | ICD-10-CM

## 2019-10-17 DIAGNOSIS — R350 Frequency of micturition: Secondary | ICD-10-CM | POA: Insufficient documentation

## 2019-10-17 DIAGNOSIS — N3090 Cystitis, unspecified without hematuria: Secondary | ICD-10-CM | POA: Insufficient documentation

## 2019-10-17 DIAGNOSIS — Z3202 Encounter for pregnancy test, result negative: Secondary | ICD-10-CM | POA: Diagnosis not present

## 2019-10-17 LAB — POCT PREGNANCY, URINE: Preg Test, Ur: NEGATIVE

## 2019-10-17 LAB — POCT URINALYSIS DIP (DEVICE)
Bilirubin Urine: NEGATIVE
Glucose, UA: NEGATIVE mg/dL
Ketones, ur: NEGATIVE mg/dL
Nitrite: NEGATIVE
Protein, ur: 30 mg/dL — AB
Specific Gravity, Urine: 1.01 (ref 1.005–1.030)
Urobilinogen, UA: 0.2 mg/dL (ref 0.0–1.0)
pH: 5.5 (ref 5.0–8.0)

## 2019-10-17 LAB — POC URINE PREG, ED: Preg Test, Ur: NEGATIVE

## 2019-10-17 MED ORDER — CEPHALEXIN 500 MG PO CAPS: 500.0000 mg | ORAL_CAPSULE | Freq: Two times a day (BID) | ORAL | 0 refills | Status: DC

## 2019-10-17 NOTE — ED Provider Notes (Signed)
[place]    ASSESSMENT & PLAN:  1. Dysuria   2. Urinary frequency   3. Cystitis      Begin: Meds ordered this encounter  Medications  . cephALEXin (KEFLEX) 500 MG capsule    Sig: Take 1 capsule (500 mg total) by mouth 2 (two) times daily.    Dispense:  14 capsule    Refill:  0    No concern for STI voiced. No signs of acute pyelonephritis. Does have a low-grade temp. Discussed. Urine culture sent. Will notify patient when results available. Will follow up with her PCP or here if not showing improvement over the next 48 hours, sooner if needed.  Outlined signs and symptoms indicating need for more acute intervention. Patient verbalized understanding. After Visit Summary given.  SUBJECTIVE:  Lori Cross is a 39 y.o. female who complains of urinary frequency, urgency and dysuria for the past 5-6 days. Without associated flank pain, fever, chills, vaginal discharge or bleeding. Gross hematuria: noted yesterday. No specific aggravating or alleviating factors reported. No LE edema. Normal PO intake without n/v/d. Without specific abdominal pain. Ambulatory without difficulty. OTC treatment: AZO without much relief. H/O UTI: yes; same symptoms.  LMP: Patient's last menstrual period was 10/02/2019.  OBJECTIVE:  Vitals:   10/17/19 1020  BP: 134/84  Pulse: 99  Resp: 16  Temp: 99.5 F (37.5 C)  TempSrc: Oral  SpO2: 98%   General appearance: alert; no distress HENT: oropharynx: moist Lungs: unlabored respirations Abdomen: soft, without reported tenderness Back: no CVA tenderness reported Extremities: no edema; symmetrical with no gross deformities Skin: warm and dry Neurologic: normal gait Psychological: alert and cooperative; normal mood and affect  Labs Reviewed  POCT URINALYSIS DIP (DEVICE) - Abnormal; Notable for the following components:      Result Value   Hgb urine dipstick MODERATE (*)    Protein, ur 30 (*)    Leukocytes,Ua LARGE (*)    All other components within normal limits  URINE CULTURE  POCT PREGNANCY, URINE  POC URINE PREG, ED    No Known Allergies  Past Medical History:  Diagnosis Date  . Medical history non-contributory    Social History   Socioeconomic History  . Marital status: Single    Spouse name: Not on file  . Number of children: Not on file  . Years of education: Not on file  . Highest education level: Not on file  Occupational History  . Not on file  Tobacco Use  . Smoking status: Never Smoker  Substance and Sexual Activity  . Alcohol use: Yes  . Drug use: No  . Sexual activity: Yes    Birth control/protection: Pill  Other Topics Concern  . Not on file  Social History Narrative  . Not on file   Social Determinants of Health   Financial Resource Strain:   . Difficulty of Paying Living Expenses: Not on file  Food Insecurity:   . Worried About Charity fundraiser in the Last Year: Not on file  . Ran Out of Food in the Last Year: Not on file  Transportation Needs:   . Lack of Transportation (Medical): Not on file  . Lack of Transportation (Non-Medical): Not on file  Physical Activity:   . Days of Exercise per Week: Not on file  . Minutes of Exercise per Session: Not on file  Stress:   . Feeling of Stress : Not on file  Social Connections:   . Frequency of Communication with Friends  and Family: Not on file  . Frequency of Social Gatherings with Friends and Family: Not on file  . Attends Religious Services: Not on file  . Active Member of Clubs or Organizations: Not on file  . Attends Banker Meetings: Not on file  . Marital Status: Not on file  Intimate Partner Violence:   . Fear of Current or Ex-Partner: Not on file  . Emotionally Abused: Not on file  . Physically Abused: Not on file  . Sexually Abused: Not on file   Family History  Problem Relation Age of Onset  . Diabetes Mother   . Hypertension Mother   . Diabetes Father   . Hypertension Father         Mardella Layman, MD 10/17/19 1055

## 2019-10-17 NOTE — ED Triage Notes (Signed)
Pt c/o burning with urination, frequency, urgency x1 week, and hematuria onset yesterday. Denies fever, chills, n/v.  Has been taking AZO and cranberry w/o improvement to sx.  Also c/o bilat flank pain.

## 2019-10-19 LAB — URINE CULTURE: Culture: >=100000 — AB

## 2020-01-20 ENCOUNTER — Other Ambulatory Visit: Payer: Self-pay

## 2020-01-20 ENCOUNTER — Ambulatory Visit (HOSPITAL_COMMUNITY)
Admission: EM | Admit: 2020-01-20 | Discharge: 2020-01-20 | Disposition: A | Discharge status: Home / Self Care | Payer: BC Managed Care – PPO | Attending: Family Medicine | Admitting: Family Medicine

## 2020-01-20 DIAGNOSIS — L299 Pruritus, unspecified: Secondary | ICD-10-CM | POA: Diagnosis present

## 2020-01-20 DIAGNOSIS — T7840XA Allergy, unspecified, initial encounter: Secondary | ICD-10-CM | POA: Diagnosis present

## 2020-01-20 DIAGNOSIS — R300 Dysuria: Secondary | ICD-10-CM | POA: Diagnosis not present

## 2020-01-20 MED ORDER — HYDROXYZINE HCL 25 MG PO TABS: 25.0000 mg | ORAL_TABLET | Freq: Four times a day (QID) | ORAL | 0 refills | Status: DC | PRN

## 2020-01-20 NOTE — Discharge Instructions (Addendum)
Stop all antibiotics Take hydroxyzine for itching Your swab result will be ready in 1-2 days.  Check My Chart for your test result You will be called if any cultures are positive

## 2020-01-20 NOTE — ED Triage Notes (Signed)
Pt states she was tx for probable UTI on 06/01 and was given Rx of nitrofurantoin and then approx 2 days later pt c/o itching and her Rx was changed to Bactrim yesterday. Pt states [facility] called pt today and advised pt to stop Rx altogether bc her urine culture was negative.  Pt reports that she took Chlor trimeton allergy medicine for itching and itching improved somewhat. Pt states she still feels "tingly" in her fingers and her feet feel "like they're on fire".  Denies SOB, facial swelling, or other edema, no signs of a rash.

## 2020-01-20 NOTE — ED Provider Notes (Signed)
[place]    CSN: [id number] Arrival date & time: 01/20/20  1609      History   Chief Complaint Chief Complaint  Patient presents with  . Pruritis    HPI Lori Cross is a 39 y.o. female.   HPI   Patient went to another urgent care center that she had a UTI because she had dysuria.  She was treated with nitrofurantoin.  This caused her to itch all over.  She stopped the nitrofurantoin and was put on Septra.  She received a phone call the following day to say her culture was negative and she did not need any antibiotics.  She is here because she still having itching and burning sensation of her skin.  She has no rash.  It is not responding to antihistamines. No trouble swallowing No trouble breathing No change in voice No known allergies Now that she knows she does not have a urinary tract infection she wonders what is causing the dysuria.  I recommend a vaginal swab to see if she has vaginitis that would contribute to discomfort  Past Medical History:  Diagnosis Date  . Medical history non-contributory     Patient Active Problem List   Diagnosis Date Noted  . Obesity (BMI 30-39.9) 08/23/2014  . Pyelonephritis 08/22/2014  . Sepsis ([place]) 08/22/2014  . UTI (lower urinary tract infection) 08/22/2014    Past Surgical History:  Procedure Laterality Date  . NO PAST SURGERIES      OB History   No obstetric history on file.      Home Medications    Prior to Admission medications   Medication Sig Start Date End Date Taking? Authorizing Provider  acetaminophen (TYLENOL) 500 MG tablet Take 1,000 mg by mouth every 6 (six) hours as needed for pain.    [provider]  hydrOXYzine (ATARAX/VISTARIL) 25 MG tablet Take 1-2 tablets (25-50 mg total) by mouth every 6 (six) hours as needed for itching. 01/20/20   Raylene Everts, MD  ibuprofen (ADVIL,MOTRIN) 200 MG tablet Take 200 mg by mouth every 6 (six) hours as needed for fever.    [provider]  NORTREL 0.5/35, 28, 0.5-35 MG-MCG tablet Take 1 tablet by mouth daily. 06/26/19   [provider]  etonogestrel (IMPLANON) 68 MG IMPL implant Inject 1 each into the skin once.  10/17/19  [provider]    Family History Family History  Problem Relation Age of Onset  . Diabetes Mother   . Hypertension Mother   . Diabetes Father   . Hypertension Father     Social History Social History   Tobacco Use  . Smoking status: Never Smoker  Substance Use Topics  . Alcohol use: Yes  . Drug use: No     Allergies   Patient has no known allergies.   Review of Systems Review of Systems  Genitourinary: Positive for dysuria and vaginal bleeding.  Skin:       Itching  Patient is menstruating    Physical Exam Triage Vital Signs ED Triage Vitals  Enc Vitals Group     BP 01/20/20 1701 (!) 137/101     Pulse Rate 01/20/20 1701 89     Resp 01/20/20 1701 18     Temp 01/20/20 1701 98.3 F (36.8 C)     Temp Source 01/20/20 1701 Oral     SpO2 01/20/20 1701 100 %     Weight --      Height --  Head Circumference --      Peak Flow --      Pain Score 01/20/20 1659 0     Pain Loc --      Pain Edu? --      Excl. in GC? --    No data found.  Updated Vital Signs BP (!) 127/91 (BP Location: Right Arm)   Pulse 84   Temp 98.3 F (36.8 C) (Oral)   Resp 18   LMP 01/17/2020   SpO2 100%     Physical Exam Constitutional:      General: She is not in acute distress.    Appearance: She is well-developed. She is not ill-appearing.  HENT:     Head: Normocephalic and atraumatic.     Mouth/Throat:     Comments: Mask is in place Eyes:     Conjunctiva/sclera: Conjunctivae normal.     Pupils: Pupils are equal, round, and reactive to light.  Cardiovascular:     Rate and Rhythm: Normal rate.     Heart sounds: Normal heart sounds.  Pulmonary:     Effort: Pulmonary effort is normal. No respiratory distress.     Breath sounds: Normal breath sounds. No  wheezing.  Musculoskeletal:        General: Normal range of motion.     Cervical back: Normal range of motion.  Skin:    General: Skin is warm and dry.     Findings: No rash.     Comments: Skin is clear  Neurological:     Mental Status: She is alert.  Psychiatric:        Mood and Affect: Mood normal.        Behavior: Behavior normal.      UC Treatments / Results  Labs (all labs ordered are listed, but only abnormal results are displayed) Labs Reviewed  CERVICOVAGINAL ANCILLARY ONLY    EKG   Radiology No results found.  Procedures Procedures (including critical care time)  Medications Ordered in UC Medications - No data to display  Initial Impression / Assessment and Plan / UC Course  I have reviewed the triage vital signs and the nursing notes.  Pertinent labs & imaging results that were available during my care of the patient were reviewed by me and considered in my medical decision making (see chart for details).     I told patient she is possible I allergic to the nitrofurantoin. I agree that we need to work-up her dysuria with a vaginal swab She will be called if any of her cultures are positive, otherwise may check my chart for test results Final Clinical Impressions(s) / UC Diagnoses   Final diagnoses:  Dysuria  Allergic reaction, initial encounter  Pruritus     Discharge Instructions     Stop all antibiotics Take hydroxyzine for itching Your swab result will be ready in 1-2 days.  Check My Chart for your test result You will be called if any cultures are positive    ED Prescriptions    Medication Sig Dispense Auth. Provider   hydrOXYzine (ATARAX/VISTARIL) 25 MG tablet Take 1-2 tablets (25-50 mg total) by mouth every 6 (six) hours as needed for itching. 20 tablet Eustace Moore, MD     PDMP not reviewed this encounter.   Eustace Moore, MD 01/20/20 [id number]

## 2020-01-23 ENCOUNTER — Telehealth (HOSPITAL_COMMUNITY): Payer: Self-pay | Admitting: Orthopedic Surgery

## 2020-01-23 LAB — CERVICOVAGINAL ANCILLARY ONLY
Bacterial Vaginitis (gardnerella): NEGATIVE
Candida Glabrata: NEGATIVE
Candida Vaginitis: POSITIVE — AB
Chlamydia: NEGATIVE
Comment: NEGATIVE
Comment: NEGATIVE
Comment: NEGATIVE
Comment: NEGATIVE
Comment: NEGATIVE
Comment: NORMAL
Neisseria Gonorrhea: NEGATIVE
Trichomonas: NEGATIVE

## 2020-01-23 MED ORDER — FLUCONAZOLE 150 MG PO TABS
150.0000 mg | ORAL_TABLET | Freq: Every day | ORAL | 0 refills | Status: AC
Start: 2020-01-23 — End: 2020-01-25

## 2020-04-02 ENCOUNTER — Other Ambulatory Visit: Payer: Self-pay

## 2020-04-02 ENCOUNTER — Encounter (HOSPITAL_COMMUNITY): Payer: Self-pay

## 2020-04-02 ENCOUNTER — Ambulatory Visit (HOSPITAL_COMMUNITY)
Admission: EM | Admit: 2020-04-02 | Discharge: 2020-04-02 | Disposition: A | Discharge status: Home / Self Care | Payer: BC Managed Care – PPO | Attending: Emergency Medicine | Admitting: Emergency Medicine

## 2020-04-02 DIAGNOSIS — R05 Cough: Secondary | ICD-10-CM | POA: Diagnosis not present

## 2020-04-02 DIAGNOSIS — E669 Obesity, unspecified: Secondary | ICD-10-CM | POA: Diagnosis not present

## 2020-04-02 DIAGNOSIS — Z20822 Contact with and (suspected) exposure to covid-19: Secondary | ICD-10-CM | POA: Insufficient documentation

## 2020-04-02 DIAGNOSIS — R059 Cough, unspecified: Secondary | ICD-10-CM

## 2020-04-02 DIAGNOSIS — Z79899 Other long term (current) drug therapy: Secondary | ICD-10-CM | POA: Insufficient documentation

## 2020-04-02 DIAGNOSIS — Z793 Long term (current) use of hormonal contraceptives: Secondary | ICD-10-CM | POA: Diagnosis not present

## 2020-04-02 DIAGNOSIS — Z791 Long term (current) use of non-steroidal anti-inflammatories (NSAID): Secondary | ICD-10-CM | POA: Insufficient documentation

## 2020-04-02 DIAGNOSIS — M545 Low back pain, unspecified: Secondary | ICD-10-CM

## 2020-04-02 LAB — SARS CORONAVIRUS 2 (TAT 6-24 HRS): SARS Coronavirus 2: NEGATIVE

## 2020-04-02 MED ORDER — BENZONATATE 100 MG PO CAPS: 100.0000 mg | ORAL_CAPSULE | Freq: Three times a day (TID) | ORAL | 0 refills | Status: DC | PRN

## 2020-04-02 MED ORDER — IBUPROFEN 800 MG PO TABS
800.0000 mg | ORAL_TABLET | Freq: Three times a day (TID) | ORAL | 0 refills | Status: DC
Start: 2020-04-02 — End: 2020-11-21

## 2020-04-02 NOTE — Discharge Instructions (Signed)
Push fluids to ensure adequate hydration and keep secretions thin.  Tylenol and/or ibuprofen as needed for pain or fevers.   Rest.  Over the counter medications such as mucinex d as needed to help with symptoms.  Self isolate until covid results are back and negative.  Will notify you by phone of any positive findings. Your negative results will be sent through your MyChart.     If symptoms worsen or do not improve in the next week to return to be seen or to follow up with your PCP.

## 2020-04-02 NOTE — ED Triage Notes (Signed)
Pt presents with cough and lower back pain x 2 days.Pt taking Tylenol and OTC cough medicine gives somewhat relief, last dose 4 hrs ago.

## 2020-04-02 NOTE — ED Provider Notes (Signed)
[facility]    CSN: [id number] Arrival date & time: 04/02/20  0802      History   Chief Complaint Chief Complaint  Patient presents with  . Cough  . Back Pain    HPI Lori Cross is a 39 y.o. female.   Lori Cross presents with complaints of cough, which is productive of mucus. Cough causes chest pain. Low back pain. Cough started two days ago. Back pain started yesterday, she woke with it. With increased activity she feels more short of breath. No fevers. Some nasal drainage. No sore throat. No ear pain. No headache, no body aches. Doesn't smoke. No asthma or COPD. No known ill contacts. She is a Training and development officer at [employer]. Received her full covid vaccine series. Has taken tylenol, which did seem to help some. Back pain was worse this morning, but once she got her shoes on the pain has improved. No known back injury. No urinary symptoms. No gi symptoms.    ROS per HPI, negative if not otherwise mentioned.       Past Medical History:  Diagnosis Date  . Medical history non-contributory     Patient Active Problem List   Diagnosis Date Noted  . Obesity (BMI 30-39.9) 08/23/2014  . Pyelonephritis 08/22/2014  . Sepsis (HCC) 08/22/2014  . UTI (lower urinary tract infection) 08/22/2014    Past Surgical History:  Procedure Laterality Date  . NO PAST SURGERIES      OB History   No obstetric history on file.      Home Medications    Prior to Admission medications   Medication Sig Start Date End Date Taking? Authorizing Provider  acetaminophen (TYLENOL) 500 MG tablet Take 1,000 mg by mouth every 6 (six) hours as needed for pain.    [provider]  benzonatate (TESSALON) 100 MG capsule Take 1-2 capsules (100-200 mg total) by mouth 3 (three) times daily as needed for cough. 04/02/20   Georgetta Haber, NP  hydrOXYzine (ATARAX/VISTARIL) 25 MG tablet Take 1-2 tablets (25-50 mg total) by mouth every 6 (six) hours as needed for itching. 01/20/20    Eustace Moore, MD  ibuprofen (ADVIL) 800 MG tablet Take 1 tablet (800 mg total) by mouth 3 (three) times daily. 04/02/20   Thayne Cindric, Dorene Grebe B, NP  NORTREL 0.5/35, 28, 0.5-35 MG-MCG tablet Take 1 tablet by mouth daily. 06/26/19   [provider]  etonogestrel (IMPLANON) 68 MG IMPL implant Inject 1 each into the skin once.  10/17/19  [provider]    Family History Family History  Problem Relation Age of Onset  . Diabetes Mother   . Hypertension Mother   . Diabetes Father   . Hypertension Father     Social History Social History   Tobacco Use  . Smoking status: Never Smoker  . Smokeless tobacco: Never Used  Substance Use Topics  . Alcohol use: Yes  . Drug use: No     Allergies   Patient has no known allergies.   Review of Systems Review of Systems   Physical Exam Triage Vital Signs ED Triage Vitals  Enc Vitals Group     BP 04/02/20 0822 112/78     Pulse Rate 04/02/20 0822 86     Resp 04/02/20 0822 18     Temp 04/02/20 0822 99.2 F (37.3 C)     Temp Source 04/02/20 0822 Oral     SpO2 04/02/20 0822 96 %     Weight --  Height --      Head Circumference --      Peak Flow --      Pain Score 04/02/20 0821 4     Pain Loc --      Pain Edu? --      Excl. in GC? --    No data found.  Updated Vital Signs BP 112/78 (BP Location: Right Arm)   Pulse 86   Temp 99.2 F (37.3 C) (Oral)   Resp 18   LMP 03/09/2020 (Exact Date)   SpO2 96%   Visual Acuity Right Eye Distance:   Left Eye Distance:   Bilateral Distance:    Right Eye Near:   Left Eye Near:    Bilateral Near:     Physical Exam Constitutional:      General: She is not in acute distress.    Appearance: She is well-developed.  Cardiovascular:     Rate and Rhythm: Normal rate and regular rhythm.  Pulmonary:     Effort: Pulmonary effort is normal.     Breath sounds: Normal breath sounds. No wheezing.  Musculoskeletal:     Lumbar back: Normal.     Comments: Pain to right low  back with flexion and with hip flexion; strength equal bilaterally; gross sensation intact to lower extremities   Skin:    General: Skin is warm and dry.  Neurological:     Mental Status: She is alert and oriented to person, place, and time.      UC Treatments / Results  Labs (all labs ordered are listed, but only abnormal results are displayed) Labs Reviewed  SARS CORONAVIRUS 2 (TAT 6-24 HRS)    EKG   Radiology No results found.  Procedures Procedures (including critical care time)  Medications Ordered in UC Medications - No data to display  Initial Impression / Assessment and Plan / UC Course  I have reviewed the triage vital signs and the nursing notes.  Pertinent labs & imaging results that were available during my care of the patient were reviewed by me and considered in my medical decision making (see chart for details).     Non toxic. Benign physical exam.  No red flag findings. History and physical consistent with viral illness.  Supportive cares recommended. covid testing collected and pending. Back pain management discussed. Return precautions provided. Patient verbalized understanding and agreeable to plan.   Final Clinical Impressions(s) / UC Diagnoses   Final diagnoses:  Cough  Acute right-sided low back pain without sciatica     Discharge Instructions     Push fluids to ensure adequate hydration and keep secretions thin.  Tylenol and/or ibuprofen as needed for pain or fevers.   Rest.  Over the counter medications such as mucinex d as needed to help with symptoms.  Self isolate until covid results are back and negative.  Will notify you by phone of any positive findings. Your negative results will be sent through your MyChart.     If symptoms worsen or do not improve in the next week to return to be seen or to follow up with your PCP.      ED Prescriptions    Medication Sig Dispense Auth. Provider   benzonatate (TESSALON) 100 MG capsule Take 1-2  capsules (100-200 mg total) by mouth 3 (three) times daily as needed for cough. 21 capsule Linus Mako B, NP   ibuprofen (ADVIL) 800 MG tablet Take 1 tablet (800 mg total) by mouth 3 (three) times daily. 30 tablet  Georgetta Haber, NP     PDMP not reviewed this encounter.   Georgetta Haber, NP 04/02/20 [phone]

## 2020-09-12 ENCOUNTER — Ambulatory Visit (HOSPITAL_COMMUNITY)
Admission: EM | Admit: 2020-09-12 | Discharge: 2020-09-12 | Disposition: A | Discharge status: Home / Self Care | Payer: BC Managed Care – PPO | Attending: Internal Medicine | Admitting: Internal Medicine

## 2020-09-12 ENCOUNTER — Encounter (HOSPITAL_COMMUNITY): Payer: Self-pay | Admitting: Emergency Medicine

## 2020-09-12 ENCOUNTER — Other Ambulatory Visit: Payer: Self-pay

## 2020-09-12 ENCOUNTER — Ambulatory Visit: Payer: BC Managed Care – PPO | Admitting: Family Medicine

## 2020-09-12 DIAGNOSIS — S838X2A Sprain of other specified parts of left knee, initial encounter: Secondary | ICD-10-CM | POA: Diagnosis not present

## 2020-09-12 MED ORDER — PREDNISONE 20 MG PO TABS
20.0000 mg | ORAL_TABLET | Freq: Every day | ORAL | 0 refills | Status: AC
Start: 2020-09-12 — End: 2020-09-17

## 2020-09-12 NOTE — ED Triage Notes (Signed)
Patient c/o LFT knee pain that started yesterday.   Patient endorses swelling and increased pain during the day.   Patient endorses worsening pain over the past 2 months.   Patient denies any falls, trauma, or MVC.   Patient endorses more pain when walking and when rising after an extended period of sitting.   Patient has used elevation w/ some relief of symptoms.

## 2020-09-12 NOTE — ED Provider Notes (Signed)
[facility]    CSN: [id number] Arrival date & time: 09/12/20  0825      History   Chief Complaint Chief Complaint  Patient presents with  . Knee Pain    HPI Lori Cross is a 40 y.o. female comes to the urgent care with left knee pain which worsened yesterday.  Patient says that the pain is worse after work and has been ongoing for the past 2 months.  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  Pain is of moderate severity, aggravated by flexing the knee.  No known relieving factors.  It is associated with swelling more so at the end of the day.  No numbness or tingling.  No falls or trauma to the knees.Marland Kitchen   HPI  Past Medical History:  Diagnosis Date  . Medical history non-contributory     Patient Active Problem List   Diagnosis Date Noted  . Obesity (BMI 30-39.9) 08/23/2014  . Pyelonephritis 08/22/2014  . Sepsis (HCC) 08/22/2014  . UTI (lower urinary tract infection) 08/22/2014    Past Surgical History:  Procedure Laterality Date  . NO PAST SURGERIES      OB History   No obstetric history on file.      Home Medications    Prior to Admission medications   Medication Sig Start Date End Date Taking? Authorizing Provider  NORTREL 0.5/35, 28, 0.5-35 MG-MCG tablet Take 1 tablet by mouth daily. 06/26/19  Yes [provider]  predniSONE (DELTASONE) 20 MG tablet Take 1 tablet (20 mg total) by mouth daily for 5 days. 09/12/20 09/17/20 Yes Benicia Bergevin, Britta Mccreedy, MD  acetaminophen (TYLENOL) 500 MG tablet Take 1,000 mg by mouth every 6 (six) hours as needed for pain.    [provider]  benzonatate (TESSALON) 100 MG capsule Take 1-2 capsules (100-200 mg total) by mouth 3 (three) times daily as needed for cough. 04/02/20   Georgetta Haber, NP  hydrOXYzine (ATARAX/VISTARIL) 25 MG tablet Take 1-2 tablets (25-50 mg total) by mouth every 6 (six) hours as needed for itching. 01/20/20   Eustace Moore, MD  ibuprofen (ADVIL) 800  MG tablet Take 1 tablet (800 mg total) by mouth 3 (three) times daily. 04/02/20   Georgetta Haber, NP  etonogestrel (IMPLANON) 68 MG IMPL implant Inject 1 each into the skin once.  10/17/19  [provider]    Family History Family History  Problem Relation Age of Onset  . Diabetes Mother   . Hypertension Mother   . Diabetes Father   . Hypertension Father     Social History Social History   Tobacco Use  . Smoking status: Never Smoker  . Smokeless tobacco: Never Used  Substance Use Topics  . Alcohol use: Yes  . Drug use: No     Allergies   Patient has no known allergies.   Review of Systems Review of Systems  Gastrointestinal: Negative.   Musculoskeletal: Positive for arthralgias and joint swelling. Negative for neck pain and neck stiffness.  Neurological: Negative.      Physical Exam Triage Vital Signs ED Triage Vitals  Enc Vitals Group     BP 09/12/20 0853 122/79     Pulse Rate 09/12/20 0853 94     Resp 09/12/20 0853 15     Temp 09/12/20 0853 98.7 F (37.1 C)     Temp Source 09/12/20 0853 Oral     SpO2 09/12/20 0853 100 %     Weight --  Height --      Head Circumference --      Peak Flow --      Pain Score 09/12/20 0852 8     Pain Loc --      Pain Edu? --      Excl. in GC? --    No data found.  Updated Vital Signs BP 122/79 (BP Location: Right Arm)   Pulse 94   Temp 98.7 F (37.1 C) (Oral)   Resp 15   LMP 09/11/2020   SpO2 100%   Visual Acuity Right Eye Distance:   Left Eye Distance:   Bilateral Distance:    Right Eye Near:   Left Eye Near:    Bilateral Near:     Physical Exam Vitals and nursing note reviewed.  Constitutional:      General: She is not in acute distress.    Appearance: She is not ill-appearing.  Cardiovascular:     Rate and Rhythm: Normal rate and regular rhythm.  Musculoskeletal:        General: No swelling.     Comments: Anterior and posterior drawer tests are negative.  No tenderness on palpation over  the medial or lateral collateral ligaments.  Neurological:     Mental Status: She is alert.      UC Treatments / Results  Labs (all labs ordered are listed, but only abnormal results are displayed) Labs Reviewed - No data to display  EKG   Radiology No results found.  Procedures Procedures (including critical care time)  Medications Ordered in UC Medications - No data to display  Initial Impression / Assessment and Plan / UC Course  I have reviewed the triage vital signs and the nursing notes.  Pertinent labs & imaging results that were available during my care of the patient were reviewed by me and considered in my medical decision making (see chart for details).     1.  Left knee sprain: Apply left knee sleeve Short course of prednisone to help with swelling and pain Icing of the knee after she returns from work If symptoms persist, she may benefit from imaging to evaluate ligaments. Final Clinical Impressions(s) / UC Diagnoses   Final diagnoses:  Sprain of other ligament of left knee, initial encounter     Discharge Instructions     Rest your knee when you get home after long days work Ice the knee to reduce swelling Take medications as prescribed If symptoms persist, you may need to follow-up with an orthopedic surgeon for imaging. Please take NSAIDs as needed for pain and swelling after you complete the course of steroids.   ED Prescriptions    Medication Sig Dispense Auth. Provider   predniSONE (DELTASONE) 20 MG tablet Take 1 tablet (20 mg total) by mouth daily for 5 days. 5 tablet Tran Randle, Britta Mccreedy, MD     PDMP not reviewed this encounter.   Merrilee Jansky, MD 09/12/20 [phone]

## 2020-09-12 NOTE — Discharge Instructions (Signed)
Rest your knee when you get home after long days work Ice the knee to reduce swelling Take medications as prescribed If symptoms persist, you may need to follow-up with an orthopedic surgeon for imaging. Please take NSAIDs as needed for pain and swelling after you complete the course of steroids.

## 2020-10-17 ENCOUNTER — Ambulatory Visit: Payer: BC Managed Care – PPO | Admitting: Family Medicine

## 2020-10-18 ENCOUNTER — Ambulatory Visit: Payer: BC Managed Care – PPO | Admitting: Family Medicine

## 2020-11-02 ENCOUNTER — Telehealth: Payer: Self-pay

## 2020-11-02 NOTE — Telephone Encounter (Signed)
Provider on PAL 4/8. Called Pt but no answer left vm to call [phone] to reschedule appt for different day.

## 2020-11-21 ENCOUNTER — Ambulatory Visit (INDEPENDENT_AMBULATORY_CARE_PROVIDER_SITE_OTHER): Payer: BC Managed Care – PPO

## 2020-11-21 ENCOUNTER — Ambulatory Visit (INDEPENDENT_AMBULATORY_CARE_PROVIDER_SITE_OTHER): Payer: BC Managed Care – PPO | Admitting: Physician Assistant

## 2020-11-21 ENCOUNTER — Other Ambulatory Visit: Payer: Self-pay

## 2020-11-21 ENCOUNTER — Encounter: Payer: Self-pay | Admitting: Physician Assistant

## 2020-11-21 VITALS — BP 112/76 | HR 82 | Temp 98.0°F | Ht 67.0 in | Wt 226.2 lb

## 2020-11-21 DIAGNOSIS — M25562 Pain in left knee: Secondary | ICD-10-CM

## 2020-11-21 MED ORDER — NAPROXEN 500 MG PO TABS
500.0000 mg | ORAL_TABLET | Freq: Two times a day (BID) | ORAL | 0 refills | Status: AC
Start: 2020-11-21 — End: 2020-12-03

## 2020-11-21 NOTE — Progress Notes (Signed)
New Patient Office Visit  Subjective:  Patient ID: Lori Cross, female    DOB: 09/02/1980  Age: 40 y.o. MRN: [id number]  CC:  Chief Complaint  Patient presents with  . New Patient (Initial Visit)  . Knee Pain    Left knee    HPI Lori Cross presents for new patient establishment. Relatively healthy female overall. She has two sons. 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  She sees [facility] on [address] for female care. They also prescribe her OCP as well. No other specialists at this time.   Acute concern: Left knee pain x several months. NKI. Getting in and out of the car, as well as going downstairs, it starts to hurt. Feels like it locks up when she stands after sitting for awhile. She has been seen at [facility] about this and says she was given a brace and round of steroids. XRAY has not been done.   Past Medical History:  Diagnosis Date  . Medical history non-contributory     Past Surgical History:  Procedure Laterality Date  . NO PAST SURGERIES      Family History  Problem Relation Age of Onset  . Diabetes Mother   . Hypertension Mother   . Diabetes Father   . Hypertension Father     Social History   Socioeconomic History  . Marital status: Single    Spouse name: Not on file  . Number of children: Not on file  . Years of education: Not on file  . Highest education level: Not on file  Occupational History  . Not on file  Tobacco Use  . Smoking status: Never Smoker  . Smokeless tobacco: Never Used  Vaping Use  . Vaping Use: Never used  Substance and Sexual Activity  . Alcohol use: Yes    Comment: Rarely  . Drug use: No  . Sexual activity: Yes  Other Topics Concern  . Not on file  Social History Narrative  . Not on file   Social Determinants of Health   Financial Resource Strain: Not on file  Food Insecurity: Not on file  Transportation Needs: Not on file  Physical Activity: Not on file   Stress: Not on file  Social Connections: Not on file  Intimate Partner Violence: Not on file    ROS Review of Systems + Left knee pain, all others negative  Objective:   Today's Vitals: BP 112/76   Pulse 82   Temp 98 F (36.7 C)   Ht 5\' 7"  (1.702 m)   Wt 226 lb 4 oz (102.6 kg)   SpO2 98%   BMI 35.44 kg/m   Physical Exam Vitals and nursing note reviewed.  Constitutional:      Appearance: Normal appearance. She is obese.  Musculoskeletal:     Left knee: Swelling present. No crepitus. Normal range of motion (she is very tender with extension of knee). Tenderness (anterior knee) present. No LCL laxity, MCL laxity, ACL laxity or PCL laxity.    Instability Tests: Medial McMurray test negative and lateral McMurray test negative.     Comments: N/V intact  Neurological:     Mental Status: She is alert.     Assessment & Plan:   Problem List Items Addressed This Visit   None   Visit Diagnoses    Left anterior knee pain    -  Primary   Relevant Orders   DG Knee Complete 4 Views Left  Outpatient Encounter Medications as of 11/21/2020  Medication Sig  . acetaminophen (TYLENOL) 500 MG tablet Take 1,000 mg by mouth every 6 (six) hours as needed for pain.  . naproxen (NAPROSYN) 500 MG tablet Take 1 tablet (500 mg total) by mouth 2 (two) times daily with a meal for 12 days.  Marland Kitchen NORTREL 0.5/35, 28, 0.5-35 MG-MCG tablet Take 1 tablet by mouth daily.  . [DISCONTINUED] benzonatate (TESSALON) 100 MG capsule Take 1-2 capsules (100-200 mg total) by mouth 3 (three) times daily as needed for cough.  . [DISCONTINUED] etonogestrel (IMPLANON) 68 MG IMPL implant Inject 1 each into the skin once.  . [DISCONTINUED] hydrOXYzine (ATARAX/VISTARIL) 25 MG tablet Take 1-2 tablets (25-50 mg total) by mouth every 6 (six) hours as needed for itching.  . [DISCONTINUED] ibuprofen (ADVIL) 800 MG tablet Take 1 tablet (800 mg total) by mouth 3 (three) times daily.   No facility-administered encounter  medications on file as of 11/21/2020.    Follow-up: No follow-ups on file.   1. Left anterior knee pain No known injury. Very well could be having issues with patellofemoral syndrome. Will obtain XRAY in office today and call with official radiology reading. Start on Naproxen as directed. Ice daily, rest, elevate, brace prn. Activity only as tolerated. Most likely will need referral to PT.   Jahred Tatar M Bryce Cheever, PA-C

## 2020-11-21 NOTE — Patient Instructions (Signed)
Good to meet you today!  Please go to lab for an XRAY of this left knee. I will call with radiology report. Take the Naprosyn as directed. Continue to ice knee at least 10-15 minutes three times daily. Use brace as needed.  Next step for your knee pain is most likely going to be physical therapy.

## 2020-11-23 ENCOUNTER — Ambulatory Visit: Payer: BC Managed Care – PPO | Admitting: Nurse Practitioner

## 2020-11-26 ENCOUNTER — Other Ambulatory Visit: Payer: Self-pay

## 2020-11-26 ENCOUNTER — Encounter: Payer: Self-pay | Admitting: Physician Assistant

## 2020-11-26 DIAGNOSIS — M25562 Pain in left knee: Secondary | ICD-10-CM

## 2020-12-10 ENCOUNTER — Ambulatory Visit: Payer: BC Managed Care – PPO | Admitting: Physical Therapy

## 2020-12-17 ENCOUNTER — Ambulatory Visit: Payer: BC Managed Care – PPO | Admitting: Physical Therapy

## 2020-12-19 ENCOUNTER — Ambulatory Visit: Payer: BC Managed Care – PPO | Admitting: Physical Therapy

## 2020-12-20 ENCOUNTER — Ambulatory Visit: Payer: BC Managed Care – PPO | Admitting: Physical Therapy

## 2020-12-20 ENCOUNTER — Encounter: Payer: Self-pay | Admitting: Physical Therapy

## 2020-12-20 ENCOUNTER — Other Ambulatory Visit: Payer: Self-pay

## 2020-12-20 DIAGNOSIS — G8929 Other chronic pain: Secondary | ICD-10-CM | POA: Diagnosis not present

## 2020-12-20 DIAGNOSIS — M25562 Pain in left knee: Secondary | ICD-10-CM

## 2020-12-20 DIAGNOSIS — M6281 Muscle weakness (generalized): Secondary | ICD-10-CM | POA: Diagnosis not present

## 2020-12-20 DIAGNOSIS — M25662 Stiffness of left knee, not elsewhere classified: Secondary | ICD-10-CM | POA: Diagnosis not present

## 2020-12-20 NOTE — Patient Instructions (Signed)
Access Code: [email or web address] URL: [email or web address] Date: 12/20/2020 Prepared by: Sedalia Muta  Exercises Supine Heel Slide - 2 x daily - 1-2 sets - 10 reps Supine Quadricep Sets - 2 x daily - 1-2 sets - 10 reps Straight Leg Raise - 1 x daily - 1-2 sets - 10 reps Seated Knee Extension AROM - 2 x daily - 1-2 sets - 10 reps

## 2020-12-20 NOTE — Therapy (Signed)
[facility] [address] [place], [place], [address] Phone: [phone]   Fax:  [phone]  Physical Therapy Evaluation  Patient Details  Name: Lori Cross MRN: [id number] Date of Birth: 06/14/1981 Referring Provider (PT): Ila McgillAlyssa Allwardt, PA-C   Encounter Date: 12/20/2020   PT End of Session - 12/20/20 1114    Visit Number 1    Number of Visits 12    Date for PT Re-Evaluation 01/31/21    Authorization Type [employer]    PT Start Time [phone]    PT Stop Time 0930    PT Time Calculation (min) 43 min    Activity Tolerance Patient tolerated treatment well    Behavior During Therapy [facility] for tasks assessed/performed           Past Medical History:  Diagnosis Date  . Medical history non-contributory     Past Surgical History:  Procedure Laterality Date  . NO PAST SURGERIES      There were no vitals filed for this visit.    Subjective Assessment - 12/20/20 0851    Subjective Pt reports L anterior knee pain and mild L knee OA that has been bothering her for a couple months but progressively worsened in recent weeks. Pt has been taking naproxsin for L knee pain and reports mild pain relief with medication. Pt reports L knee pain when walking down hills. Pt reports pain relief at rest but that her knee "locks up" if she sits for an hour which causes her increased pain. Pt wears a knee brace during the day and at work to relieve pain. Pt works as a Training and development officerpersonal shopper at [employer] and walks down hill at work to get to shoppers cars. Pt reports she does alot of walking at work but only experiences pain after walking down hill, not on level surfaces. Pt reports she used to bike for physical activity but stopped a couple months ago because she feared biking would make her pain worse.    Limitations Sitting;Standing;Walking    How long can you sit comfortably? Pt reports her knee "locks up" after sitting for an hour.    Diagnostic  tests X-ray revealed mild tricompartmental OA with tricompartmental spurring.    Patient Stated Goals decreas L knee pain, be able to bike without pain    Currently in Pain? Yes    Pain Score 8    Pt reports 8/10 pain with activity   Pain Location Knee    Pain Orientation Left;Anterior    Pain Descriptors / Indicators Aching;Other (Comment)   stiff   Pain Type Chronic pain    Pain Onset More than a month ago    Pain Frequency Intermittent    Aggravating Factors  walking down hill, sitting for an hour, extension    Pain Relieving Factors elevation, naproxsin, rest    Effect of Pain on Daily Activities Pt is able to work her normal work schedule but has reduced her physical activity level due to fear of making pain worse              [facility] PT Assessment - 12/20/20 0001      Assessment   Medical Diagnosis L anterior knee pain    Referring Provider (PT) Alyssa Allwardt, PA-C    Prior Therapy no      Precautions   Precautions None      Restrictions   Weight Bearing Restrictions No      Balance Screen  Has the patient fallen in the past 6 months No      Prior Function   Level of Independence Independent      Cognition   Overall Cognitive Status Within Functional Limits for tasks assessed      Observation/Other Assessments   Other Surveys  Lower Extremity Functional Scale    Lower Extremity Functional Scale  56/80 (70%)      ROM / Strength   AROM / PROM / Strength AROM;Strength      AROM   Overall AROM Comments R knee 3/0/120; L knee 0/5/114    AROM Assessment Site Knee    Right/Left Knee Right;Left    Left Knee Flexion --   pain     Strength   Overall Strength Comments bil hip motions 5/5    Strength Assessment Site Hip;Knee    Right/Left Hip Right;Left    Right/Left Knee Right;Left    Right Knee Flexion 4+/5    Right Knee Extension 4/5    Left Knee Flexion 4/5    Left Knee Extension 4/5      Flexibility   Soft Tissue Assessment /Muscle Length yes     Hamstrings R 42; L 50   L knee limited due to stiffness in L knee joint     Palpation   Palpation comment Unable to reproduce pain with palpations in L knee. Apprehensive full knee extension      Special Tests    Special Tests Laxity/Instability Tests;Knee Special Tests;Meniscus Tests    Laxity/Instability  Anterior drawer test;Posterior drawer test;other;other2    Meniscus Tests McMurray Test      Anterior drawer test   Findings Negative    Side Left      Posterior drawer test   Findings Negative    Side  Left      Other   side Left    comment negative valgus and varus stress tests      McMurray Test   Side Left    Comments pt reported some pain after McMuarrays test      Ambulation/Gait   Ambulation/Gait Yes    Gait Comments Pt does not reach full L knee extension at terminal swing and remains in knee flexion throughout gait. Pts L knee is externally rotated at initial contact and during stance phase. Pt makes initial contact with L foot externally rotated. Patient slightly circumducts L hip in swing phase. Pt was able to make a heel strike with foot facing anteriorly and prevent L knee ER when cued.                      Objective measurements completed on examination: See above findings.       [facility] Adult PT Treatment/Exercise - 12/20/20 0001      Exercises   Exercises Knee/Hip      Knee/Hip Exercises: Seated   Long Arc Quad Left;10 reps    Long Arc Quad Limitations cue to fully extend L knee if able      Knee/Hip Exercises: Supine   Quad Sets Left;10 reps    Heel Slides 10 reps;Left    Straight Leg Raises 10 reps      Manual Therapy   Manual Therapy Joint mobilization;Passive ROM    Joint Mobilization to increase L knee extension                  PT Education - 12/20/20 1113    Education Details inital HEP, examination findings, PT  POC    Person(s) Educated Patient    Methods Explanation;Demonstration;Tactile cues;Verbal cues;Handout     Comprehension Verbalized understanding;Returned demonstration;Verbal cues required;Tactile cues required;Need further instruction            PT Short Term Goals - 12/20/20 1128      PT SHORT TERM GOAL #1   Title Pt will be able to perform initial HEP independently    Time 2    Period Weeks    Status New    Target Date 01/03/21      PT SHORT TERM GOAL #2   Title Pt will be able to perform 10 squats wthout pain for greater ability to perform functional activities without pain    Baseline Pt reports difficulty getting into and out of a car and bathtub    Time 2    Period Weeks    Status New    Target Date 01/03/21             PT Long Term Goals - 12/20/20 1126      PT LONG TERM GOAL #1   Title Pt will be able to perform final HEP independently    Time 6    Period Weeks    Status New    Target Date 01/31/21      PT LONG TERM GOAL #2   Title Pt will increase L knee strength to 5/5 with all motions for improved ability to walk at work without pain    Baseline --    Time 6    Period Weeks    Status New    Target Date 01/31/21      PT LONG TERM GOAL #3   Title Pt will reports reduced knee pain to 0/10 when walking for at least 30 min at work, as well as on hills    Time 6    Period Weeks    Status New    Target Date 01/31/21      PT LONG TERM GOAL #4   Title Pt will be able to ambulate with optimal mechanics to improve ability and pain with work duties.    Baseline Pts L knee remains in flexion throughout gait cycle, L hip circumducts in swing, L foot is externally rotated in stance phase.    Time 6    Period Weeks    Status New    Target Date 01/31/21      PT LONG TERM GOAL #5   Title Pt will be able to bike for 30 minutes without L knee pain, for improved ability for exercise    Baseline Pt has not biked in weeks due to pain    Time 6    Period Weeks    Status New    Target Date 01/31/21                  Plan - 12/20/20 0925    Clinical  Impression Statement Pt is a 40 year old female with L anterior knee pain and stiffness, quadriceps weakness, and limited L knee extension consistent with L knee OA. Pt has L anterior knee pain that causes her pain at work and dificulty being able to sit for long periods of time. Pt has weakness in bil quadriceps muscle and has not been able to bike for exercise due to L knee pain. Pt has limited AROM in L knee extension due to quadriceps weakness and stiffness in L knee joint. Pt would benefit from gait training to  reach full L knee extension in terminal swing, prevent L hip circumduction in swing, and prevent L ankle ER during stance phase. PT is necessary to strengthen pts quadriceps muscle so that she can return to physical activity without pain and be able to complete full L knee AROM without pain or stiffness. Plan to practice closed chain exercises to strengthen pts quadriceps and gait training to prevent further anterior knee pain due to remaining in L knee flexion throughout gait.    Personal Factors and Comorbidities Profession    Examination-Activity Limitations Bathing;Locomotion Level;Sit;Squat;Stairs    Examination-Participation Restrictions Occupation;Community Activity    Stability/Clinical Decision Making Stable/Uncomplicated    Clinical Decision Making Low    Rehab Potential Good    PT Frequency 2x / week    PT Duration 6 weeks    PT Treatment/Interventions ADLs/Self Care Home Management;Cryotherapy;Electrical Stimulation;Iontophoresis 4mg /ml Dexamethasone;Moist Heat;Traction;Ultrasound;Gait training;Stair training;Functional mobility training;Therapeutic activities;Therapeutic exercise;Balance training;Neuromuscular re-education;Patient/family education;Manual techniques;Passive range of motion;Dry needling;Taping;Spinal Manipulations;Joint Manipulations    PT Next Visit Plan squats, stair climbing, bike, gait training    PT Home Exercise Plan [email or web address]    Consulted and Agree with Plan  of Care Patient           Patient will benefit from skilled therapeutic intervention in order to improve the following deficits and impairments:  Abnormal gait,Decreased range of motion,Pain,Hypomobility,Decreased strength,Difficulty walking,Decreased activity tolerance,Impaired flexibility  Visit Diagnosis: Chronic pain of left knee  Muscle weakness (generalized)  Stiffness of left knee, not elsewhere classified     Problem List Patient Active Problem List   Diagnosis Date Noted  . Obesity (BMI 30-39.9) 08/23/2014  . Pyelonephritis 08/22/2014  . Sepsis (HCC) 08/22/2014  . UTI (lower urinary tract infection) 08/22/2014   10/21/2014 SPT 12/20/2020 02/19/2021, PT, DPT 11:48 AM  12/20/20   This entire session was performed under direct supervision and direction of a licensed therapist/therapist assistant . I have personally read, edited and approve of the note as written.    [facility] [address] [place], [place], [place] Phone: [phone]   Fax:  [phone]  Name: Lori Cross MRN: Lori Cross Date of Birth: [birth year]

## 2020-12-28 ENCOUNTER — Ambulatory Visit: Payer: BC Managed Care – PPO | Admitting: Physical Therapy

## 2020-12-28 ENCOUNTER — Encounter: Payer: Self-pay | Admitting: Physical Therapy

## 2020-12-28 ENCOUNTER — Other Ambulatory Visit: Payer: Self-pay

## 2020-12-28 DIAGNOSIS — G8929 Other chronic pain: Secondary | ICD-10-CM

## 2020-12-28 DIAGNOSIS — M25662 Stiffness of left knee, not elsewhere classified: Secondary | ICD-10-CM | POA: Diagnosis not present

## 2020-12-28 DIAGNOSIS — M25562 Pain in left knee: Secondary | ICD-10-CM

## 2020-12-28 DIAGNOSIS — M6281 Muscle weakness (generalized): Secondary | ICD-10-CM | POA: Diagnosis not present

## 2020-12-28 NOTE — Patient Instructions (Addendum)
Access Code: [email or web address] URL: [email or web address] Date: 12/28/2020 Prepared by: Zebedee Iba  Exercises Supine Heel Slide - 2 x daily - 1-2 sets - 10 reps Supine Quadricep Sets - 2 x daily - 1-2 sets - 10 reps Straight Leg Raise - 1 x daily - 1-2 sets - 10 reps Seated Isometric Knee Extension - 2 x daily - 2 sets - 10 reps - 5 hold Heel toe Rocking - 1 x daily - 2 sets - 10 reps

## 2020-12-28 NOTE — Therapy (Addendum)
[place] [address] [place], [place], [address] Phone: [phone]   Fax:  [phone]  Physical Therapy Treatment/ Discharge  PHYSICAL THERAPY DISCHARGE SUMMARY  Visits from Start of Care: 2  Plan: Patient agrees to discharge.  Patient goals were not met. Patient is being discharged due to not returning to therapy.        Patient Details  Name: Lori Cross MRN: [id number] Date of Birth: [birth year] Referring Provider (PT): Theresa Duty, PA-C   Encounter Date: 12/28/2020   PT End of Session - 12/28/20 0955     Visit Number 2    Number of Visits 12    Date for PT Re-Evaluation 01/31/21    Authorization Type Blue Cross [place]    PT Start Time [phone]    PT Stop Time 1015    PT Time Calculation (min) 42 min    Activity Tolerance Patient tolerated treatment well    Behavior During Therapy [facility] for tasks assessed/performed             Past Medical History:  Diagnosis Date   Medical history non-contributory     Past Surgical History:  Procedure Laterality Date   NO PAST SURGERIES      There were no vitals filed for this visit.   Subjective Assessment - 12/28/20 0936     Subjective Pt reports that a few days ago, she had increased knee pain while at work. She had to use a grocery cart for support due to the pain. After, going up stairs at home took 10 mins to get up one flight. Pt states HEP did not give her pain but she had mixed compliance. ~3 days of performing HEP.    Limitations Sitting;Standing;Walking    How long can you sit comfortably? Pt reports her knee "locks up" after sitting for an hour.    Diagnostic tests X-ray revealed mild tricompartmental OA with tricompartmental spurring.    Patient Stated Goals decreas L knee pain, be able to bike without pain    Currently in Pain? Yes    Pain Score 2     Pain Location Knee    Pain Orientation Left    Pain Descriptors / Indicators Aching    Pain  Onset More than a month ago                               [facility] Adult PT Treatment/Exercise - 12/28/20 0001       Exercises   Exercises Knee/Hip      Knee/Hip Exercises: Standing   Other Standing Knee Exercises heel toe rocking 10x      Knee/Hip Exercises: Seated   Long Arc Quad Left;10 reps (stopped due to increasing pain and inability to reach extension)   Long Arc Quad Limitations --    Other Seated Knee/Hip Exercises knee extension isometric 10x 5s (90 deg knee flexion)     Knee/Hip Exercises: Supine   Quad Sets Left;10 reps;2 sets    Heel Slides Left;15 reps    Straight Leg Raises 20 reps      Manual Therapy   Manual Therapy Joint mobilization;Soft tissue mobilization    Joint Mobilization grade I-II knee extesion and flexion mob; open pack    Soft tissue mobilization L VL and rec fem            TTP along lateral aspect at Gerdy's tubercle and medial joint line  PT Education - 12/28/20 0955     Education Details MOI, gait mechanics, acceptable levels of pain, anatomy, exercise progression, DOMS expectations, muscle firing, HEP, POC    Person(s) Educated Patient    Methods Explanation;Demonstration;Tactile cues;Verbal cues;Handout    Comprehension Verbalized understanding;Returned demonstration;Verbal cues required;Tactile cues required              PT Short Term Goals - 12/20/20 1128       PT SHORT TERM GOAL #1   Title Pt will be able to perform initial HEP independently    Time 2    Period Weeks    Status New    Target Date 01/03/21      PT SHORT TERM GOAL #2   Title Pt will be able to perform 10 squats wthout pain for greater ability to perform functional activities without pain    Baseline Pt reports difficulty getting into and out of a car and bathtub    Time 2    Period Weeks    Status New    Target Date 01/03/21               PT Long Term Goals - 12/20/20 1126       PT LONG TERM GOAL #1   Title Pt  will be able to perform final HEP independently    Time 6    Period Weeks    Status New    Target Date 01/31/21      PT LONG TERM GOAL #2   Title Pt will increase L knee strength to 5/5 with all motions for improved ability to walk at work without pain    Baseline --    Time 6    Period Weeks    Status New    Target Date 01/31/21      PT LONG TERM GOAL #3   Title Pt will reports reduced knee pain to 0/10 when walking for at least 30 min at work, as well as on hills    Time 6    Period Weeks    Status New    Target Date 01/31/21      PT LONG TERM GOAL #4   Title Pt will be able to ambulate with optimal mechanics to improve ability and pain with work duties.    Baseline Pts L knee remains in flexion throughout gait cycle, L hip circumducts in swing, L foot is externally rotated in stance phase.    Time 6    Period Weeks    Status New    Target Date 01/31/21      PT LONG TERM GOAL #5   Title Pt will be able to bike for 30 minutes without L knee pain, for improved ability for exercise    Baseline Pt has not biked in weeks due to pain    Time 6    Period Weeks    Status New    Target Date 01/31/21                   Plan - 12/28/20 0935     Clinical Impression Statement Pt presents with antalgic gait pattern and excessive tibial ER throughout stance phase, likely to avoid full weigth acceptance. Pt was able to correct for positioning with VC and TC for neutral tibial alignment. Pt found relief of sx with quad activation exercise and introduction of isometrics. Pt will require further reinforcement for normalized gait mechanics to reduce rotational shearing stress across the knee.  HEP was reviewed in detial to promote better follow through at home. Pt would benefit from continued skilled therapy in order to reach goals and maximize functional L LE strength and ROM for full return to PLOF.    Personal Factors and Comorbidities Profession    Examination-Activity Limitations  Bathing;Locomotion Level;Sit;Squat;Stairs    Examination-Participation Restrictions Occupation;Community Activity    Stability/Clinical Decision Making Stable/Uncomplicated    Rehab Potential Good    PT Frequency 2x / week    PT Duration 6 weeks    PT Treatment/Interventions ADLs/Self Care Home Management;Cryotherapy;Electrical Stimulation;Iontophoresis 61m/ml Dexamethasone;Moist Heat;Traction;Ultrasound;Gait training;Stair training;Functional mobility training;Therapeutic activities;Therapeutic exercise;Balance training;Neuromuscular re-education;Patient/family education;Manual techniques;Passive range of motion;Dry needling;Taping;Spinal Manipulations;Joint Manipulations    PT Next Visit Plan review HEP, clamshell/hip ABD activation, HR/TR, SLS    PT Home Exercise Plan [email or web address]    Consulted and Agree with Plan of Care Patient             Patient will benefit from skilled therapeutic intervention in order to improve the following deficits and impairments:  Abnormal gait,Decreased range of motion,Pain,Hypomobility,Decreased strength,Difficulty walking,Decreased activity tolerance,Impaired flexibility  Visit Diagnosis: Chronic pain of left knee  Muscle weakness (generalized)  Stiffness of left knee, not elsewhere classified  Left anterior knee pain     Problem List Patient Active Problem List   Diagnosis Date Noted   Obesity (BMI 30-39.9) 08/23/2014   Pyelonephritis 08/22/2014   [place] 08/22/2014   UTI (lower urinary tract infection) 08/22/2014   ADaleen BoPT, DPT 12/28/20 12:44 PM   [place] [address]Phone: [phone]  Fax:  [phone] Name: LEMILIA KAYESMRN: [id number] of Birth: 308-06-1981

## 2021-01-02 ENCOUNTER — Encounter: Payer: BC Managed Care – PPO | Admitting: Physical Therapy

## 2021-01-03 ENCOUNTER — Encounter: Payer: BC Managed Care – PPO | Admitting: Physical Therapy

## 2021-01-07 ENCOUNTER — Other Ambulatory Visit: Payer: Self-pay

## 2021-01-07 ENCOUNTER — Ambulatory Visit: Payer: BC Managed Care – PPO | Admitting: Family

## 2021-01-08 ENCOUNTER — Encounter: Payer: BC Managed Care – PPO | Admitting: Physical Therapy

## 2021-01-08 ENCOUNTER — Ambulatory Visit: Payer: BC Managed Care – PPO | Admitting: Family

## 2021-01-15 ENCOUNTER — Encounter: Payer: Self-pay | Admitting: Physician Assistant

## 2021-01-22 ENCOUNTER — Encounter: Payer: BC Managed Care – PPO | Admitting: Family Medicine

## 2021-02-07 ENCOUNTER — Telehealth: Payer: Self-pay | Admitting: Physician Assistant

## 2021-02-07 NOTE — Telephone Encounter (Signed)
Provider Zelda working virtual on

## 2021-02-08 NOTE — Telephone Encounter (Signed)
Disregard

## 2021-02-12 ENCOUNTER — Telehealth: Payer: Self-pay | Admitting: Nurse Practitioner

## 2021-02-12 ENCOUNTER — Other Ambulatory Visit: Payer: Self-pay

## 2021-02-12 ENCOUNTER — Ambulatory Visit: Payer: Self-pay | Attending: Nurse Practitioner | Admitting: Nurse Practitioner

## 2021-02-12 NOTE — Telephone Encounter (Signed)
No answer. LVM ?

## 2021-02-25 ENCOUNTER — Encounter: Payer: Medicaid Other | Admitting: Physician Assistant

## 2021-05-09 ENCOUNTER — Encounter: Payer: Self-pay | Admitting: Physician Assistant

## 2021-05-09 ENCOUNTER — Other Ambulatory Visit: Payer: Self-pay

## 2021-05-09 ENCOUNTER — Ambulatory Visit (INDEPENDENT_AMBULATORY_CARE_PROVIDER_SITE_OTHER): Payer: 59 | Admitting: Physician Assistant

## 2021-05-09 VITALS — BP 104/69 | HR 102 | Temp 98.7°F | Ht 67.0 in | Wt 216.5 lb

## 2021-05-09 DIAGNOSIS — Z13220 Encounter for screening for lipoid disorders: Secondary | ICD-10-CM

## 2021-05-09 DIAGNOSIS — Z131 Encounter for screening for diabetes mellitus: Secondary | ICD-10-CM

## 2021-05-09 DIAGNOSIS — Z0000 Encounter for general adult medical examination without abnormal findings: Secondary | ICD-10-CM

## 2021-05-09 NOTE — Progress Notes (Signed)
Subjective:    Patient ID: Lori Cross, female    DOB: Sep 24, 1980, 40 y.o.   MRN: [id number]  Chief Complaint  Patient presents with   Annual Exam    HPI Patient is in today for annual exam.  Acute concerns: None  Health maintenance: Lifestyle/ exercise: Sees a trainer every Friday and usually enjoys Zumba and walking  Nutrition: Could be better Mental health: No issues  Sleep: Sleeps well  Sexual activity: Active with a female about 1 month ago  Immunizations: UTD, declines flu Pap: UTD 10/2019 with GYN  Menstrual cycles: On OCP   Past Medical History:  Diagnosis Date   Medical history non-contributory     Past Surgical History:  Procedure Laterality Date   NO PAST SURGERIES      Family History  Problem Relation Age of Onset   Diabetes Mother    Hypertension Mother    Diabetes Father    Hypertension Father     Social History   Tobacco Use   Smoking status: Never   Smokeless tobacco: Never  Vaping Use   Vaping Use: Never used  Substance Use Topics   Alcohol use: Yes    Comment: Rarely   Drug use: No     No Known Allergies  Review of Systems  Constitutional:  Negative for activity change, appetite change, fever and unexpected weight change.  HENT:  Negative for congestion.   Eyes:  Negative for visual disturbance.  Respiratory:  Negative for apnea, cough and shortness of breath.   Cardiovascular:  Negative for chest pain, palpitations and leg swelling.  Gastrointestinal:  Negative for abdominal pain, blood in stool, constipation and diarrhea.  Endocrine: Negative for polydipsia, polyphagia and polyuria.  Genitourinary:  Negative for dysuria and pelvic pain.  Musculoskeletal:  Negative for arthralgias.  Skin:  Negative for rash.  Neurological:  Negative for dizziness, weakness and headaches.  Hematological:  Negative for adenopathy. Does not bruise/bleed easily.  Psychiatric/Behavioral:  Negative for sleep disturbance and suicidal ideas. The  patient is not nervous/anxious.        Objective:     BP 104/69   Pulse (!) 102   Temp 98.7 F (37.1 C)   Ht 5\' 7"  (1.702 m)   Wt 216 lb 8 oz (98.2 kg)   LMP 05/03/2021   SpO2 98%   BMI 33.91 kg/m   Wt Readings from Last 3 Encounters:  05/09/21 216 lb 8 oz (98.2 kg)  11/21/20 226 lb 4 oz (102.6 kg)  08/25/14 231 lb 7.7 oz (105 kg)    BP Readings from Last 3 Encounters:  05/09/21 104/69  11/21/20 112/76  09/12/20 122/79     Physical Exam Vitals and nursing note reviewed. Exam conducted with a chaperone present.  Constitutional:      Appearance: Normal appearance. She is normal weight. She is not toxic-appearing.  HENT:     Head: Normocephalic and atraumatic.     Right Ear: Tympanic membrane, ear canal and external ear normal.     Left Ear: Tympanic membrane, ear canal and external ear normal.     Nose: Nose normal.     Mouth/Throat:     Mouth: Mucous membranes are moist.  Eyes:     Extraocular Movements: Extraocular movements intact.     Conjunctiva/sclera: Conjunctivae normal.     Pupils: Pupils are equal, round, and reactive to light.  Cardiovascular:     Rate and Rhythm: Normal rate and regular rhythm.     Pulses:  Normal pulses.     Heart sounds: Normal heart sounds.  Pulmonary:     Effort: Pulmonary effort is normal.     Breath sounds: Normal breath sounds.  Chest:  Breasts:    Right: Normal. No mass, nipple discharge or tenderness.     Left: Normal. No mass, nipple discharge or tenderness.  Abdominal:     General: Abdomen is flat. Bowel sounds are normal.     Palpations: Abdomen is soft.  Musculoskeletal:        General: Normal range of motion.     Cervical back: Normal range of motion and neck supple.  Skin:    General: Skin is warm and dry.  Neurological:     General: No focal deficit present.     Mental Status: She is alert and oriented to person, place, and time.  Psychiatric:        Mood and Affect: Mood normal.        Behavior: Behavior  normal.        Thought Content: Thought content normal.        Judgment: Judgment normal.       Assessment & Plan:   Problem List Items Addressed This Visit   None Visit Diagnoses     Encounter for annual physical exam    -  Primary   Relevant Orders   CBC with Differential/Platelet   Comprehensive metabolic panel   Lipid panel   Diabetes mellitus screening       Relevant Orders   Comprehensive metabolic panel   Screening for cholesterol level       Relevant Orders   Lipid panel       Age-appropriate screening and counseling performed today. Will check labs and call with results. Preventive measures discussed and printed in AVS for patient. She is doing well with an exercise trainer. She has f/up with GYN annually. Vision / dental UTD.    Koren Sermersheim M Kaliegh Willadsen, PA-C

## 2021-05-09 NOTE — Patient Instructions (Signed)
Good to see you today! Please go to the lab for blood work and I will send results through MyChart. Keep up good work with dietary changes and exercise. Slowly add one additional day of exercise. Cont regular follow up with GYN. Call if any concerns

## 2021-05-10 LAB — COMPREHENSIVE METABOLIC PANEL
ALT: 22 U/L (ref 0–35)
AST: 25 U/L (ref 0–37)
Albumin: 4.1 g/dL (ref 3.5–5.2)
Alkaline Phosphatase: 50 U/L (ref 39–117)
BUN: 12 mg/dL (ref 6–23)
CO2: 26 mEq/L (ref 19–32)
Calcium: 9.6 mg/dL (ref 8.4–10.5)
Chloride: 107 mEq/L (ref 96–112)
Creatinine, Ser: 0.89 mg/dL (ref 0.40–1.20)
GFR: 82.17 mL/min (ref >60.00)
Glucose, Bld: 83 mg/dL (ref 70–99)
Potassium: 4 mEq/L (ref 3.5–5.1)
Sodium: 138 mEq/L (ref 135–145)
Total Bilirubin: 0.4 mg/dL (ref 0.2–1.2)
Total Protein: 6.8 g/dL (ref 6.0–8.3)

## 2021-05-10 LAB — CBC WITH DIFFERENTIAL/PLATELET
Basophils Absolute: 0.1 10*3/uL (ref 0.0–0.1)
Basophils Relative: 0.6 % (ref 0.0–3.0)
Eosinophils Absolute: 0.2 10*3/uL (ref 0.0–0.7)
Eosinophils Relative: 2.3 % (ref 0.0–5.0)
HCT: 37.7 % (ref 36.0–46.0)
Hemoglobin: 12.9 g/dL (ref 12.0–15.0)
Lymphocytes Relative: 30.8 % (ref 12.0–46.0)
Lymphs Abs: 3.2 10*3/uL (ref 0.7–4.0)
MCHC: 34.2 g/dL (ref 30.0–36.0)
MCV: 90.2 fl (ref 78.0–100.0)
Monocytes Absolute: 0.7 10*3/uL (ref 0.1–1.0)
Monocytes Relative: 6.6 % (ref 3.0–12.0)
Neutro Abs: 6.1 10*3/uL (ref 1.4–7.7)
Neutrophils Relative %: 59.7 % (ref 43.0–77.0)
Platelets: 341 10*3/uL (ref 150.0–400.0)
RBC: 4.18 Mil/uL (ref 3.87–5.11)
RDW: 13.7 % (ref 11.5–15.5)
WBC: 10.3 10*3/uL (ref 4.0–10.5)

## 2021-05-10 LAB — LIPID PANEL
Cholesterol: 176 mg/dL (ref 0–200)
HDL: 48.5 mg/dL (ref >39.00)
LDL Cholesterol: 95 mg/dL (ref 0–99)
NonHDL: 127.25
Total CHOL/HDL Ratio: 4
Triglycerides: 159 mg/dL — ABNORMAL HIGH (ref 0.0–149.0)
VLDL: 31.8 mg/dL (ref 0.0–40.0)

## 2021-05-29 ENCOUNTER — Ambulatory Visit (INDEPENDENT_AMBULATORY_CARE_PROVIDER_SITE_OTHER): Payer: 59 | Admitting: Family

## 2021-05-29 ENCOUNTER — Encounter: Payer: Self-pay | Admitting: Family

## 2021-05-29 ENCOUNTER — Other Ambulatory Visit: Payer: Self-pay

## 2021-05-29 DIAGNOSIS — N3090 Cystitis, unspecified without hematuria: Secondary | ICD-10-CM

## 2021-05-29 LAB — POCT URINALYSIS DIPSTICK
Bilirubin, UA: NEGATIVE
Blood, UA: POSITIVE
Glucose, UA: NEGATIVE
Ketones, UA: NEGATIVE
Leukocytes, UA: NEGATIVE
Nitrite, UA: POSITIVE
Protein, UA: NEGATIVE
Spec Grav, UA: 1.015 (ref 1.010–1.025)
Urobilinogen, UA: 1 E.U./dL
pH, UA: 6 (ref 5.0–8.0)

## 2021-05-29 MED ORDER — SULFAMETHOXAZOLE-TRIMETHOPRIM 800-160 MG PO TABS: 1.0000 | ORAL_TABLET | Freq: Two times a day (BID) | ORAL | 0 refills | Status: AC

## 2021-05-29 NOTE — Progress Notes (Signed)
   Subjective:     Patient ID: Lori Cross, female    DOB: Mar 05, 1981, 40 y.o.   MRN: [id number]  Chief Complaint  Patient presents with   Urine Odor    Started Monday. She denies lower back pain, or dysuria.    Pelvic Pain    She complains of pelvic pressure when going.     HPI Patient is in today for urinary concerns of foul odor smell, some urgency,  pelvic pain/pressure, frequency. Denies dysuria or low back pain.  Health Maintenance Due  Topic Date Due   HIV Screening  Never done   Hepatitis C Screening  Never done    Past Medical History:  Diagnosis Date   Medical history non-contributory     Past Surgical History:  Procedure Laterality Date   NO PAST SURGERIES         Outpatient Medications Prior to Visit  Medication Sig Dispense Refill   acetaminophen (TYLENOL) 500 MG tablet Take 1,000 mg by mouth every 6 (six) hours as needed for pain. (Patient not taking: Reported on 05/29/2021)     NORTREL 0.5/35, 28, 0.5-35 MG-MCG tablet Take 1 tablet by mouth daily. (Patient not taking: No sig reported)     No facility-administered medications prior to visit.    No Known Allergies      Objective:    Physical Exam Vitals and nursing note reviewed.  Constitutional:      Appearance: Normal appearance.  Cardiovascular:     Rate and Rhythm: Normal rate and regular rhythm.  Pulmonary:     Effort: Pulmonary effort is normal.     Breath sounds: Normal breath sounds.  Musculoskeletal:        General: Normal range of motion.  Skin:    General: Skin is warm and dry.  Neurological:     Mental Status: She is alert.  Psychiatric:        Mood and Affect: Mood normal.        Behavior: Behavior normal.    BP 116/77   Pulse 93   Temp 98 F (36.7 C) (Temporal)   Ht 5\' 7"  (1.702 m)   Wt 217 lb 6.4 oz (98.6 kg)   LMP 05/03/2021   SpO2 100%   BMI 34.05 kg/m  Wt Readings from Last 3 Encounters:  05/29/21 217 lb 6.4 oz (98.6 kg)  05/09/21 216 lb 8 oz (98.2 kg)   11/21/20 226 lb 4 oz (102.6 kg)       Assessment & Plan:   Problem List Items Addressed This Visit       Genitourinary   Cystitis    +nitrites, blood, cloudy - sending Bactrim x 3d.      Relevant Orders   POCT Urinalysis Dipstick (Completed)    I am having Vanetta A. Ines start on sulfamethoxazole-trimethoprim. I am also having her maintain her acetaminophen and Nortrel 0.5/35 (28).  Meds ordered this encounter  Medications   sulfamethoxazole-trimethoprim (BACTRIM DS) 800-160 MG tablet    Sig: Take 1 tablet by mouth 2 (two) times daily after a meal for 3 days.    Dispense:  6 tablet    Refill:  0    Order Specific Question:   Supervising Provider    Answer:   ANDY, CAMILLE L [[id number]]

## 2021-05-29 NOTE — Assessment & Plan Note (Signed)
+  nitrites, blood, cloudy - sending Bactrim x 3d.

## 2021-05-29 NOTE — Patient Instructions (Signed)
Antibiotic has been sent to your pharmacy, start today, take AFTER eating something. Drink at least 2 liters of water daily. Call the office if symptoms worsen or do not resolve.

## 2021-07-18 ENCOUNTER — Encounter: Payer: Self-pay | Admitting: Family Medicine

## 2021-07-18 ENCOUNTER — Other Ambulatory Visit: Payer: Self-pay

## 2021-07-18 ENCOUNTER — Telehealth (INDEPENDENT_AMBULATORY_CARE_PROVIDER_SITE_OTHER): Payer: 59 | Admitting: Family Medicine

## 2021-07-18 VITALS — Ht 67.0 in

## 2021-07-18 DIAGNOSIS — B349 Viral infection, unspecified: Secondary | ICD-10-CM

## 2021-07-18 MED ORDER — DOXYCYCLINE HYCLATE 100 MG PO TABS: 100.0000 mg | ORAL_TABLET | Freq: Two times a day (BID) | ORAL | 0 refills | Status: DC

## 2021-07-18 NOTE — Progress Notes (Signed)
      Lori Cross T. Trine Fread, MD Primary Care and Sports Medicine [facility] [address] [place], [address] Phone: [phone]  FAX: [phone]  BRAILEIGH Cross - 40 y.o. female  MRN [id number]  Date of Birth: 09-28-1980  Visit Date: 07/18/2021  PCP: Bary Leriche, PA-C  Referred by: Allwardt, Crist Infante, PA-C  Virtual Visit via Video Note:  I connected with  [name] on 07/18/2021 10:40 AM EST by a video enabled telemedicine application and verified that I am speaking with the correct person using two identifiers.   Location patient: home computer, tablet, or smartphone Location provider: work or home office Consent: Verbal consent directly obtained from [employer]. Persons participating in the virtual visit: patient, provider  I discussed the limitations of evaluation and management by telemedicine and the availability of in person appointments. The patient expressed understanding and agreed to proceed.  Chief Complaint  Patient presents with   Nasal Congestion    Negative home Covid test yesterday. Symptoms started on Monday    History of Present Illness:  Nose is sore from blowing.  Fatigue right now.  Used a neti pot.   Drainage from her nose.   No cough No HA No ST PO - no change No GI.  A little bit of body aches?  Dayquil and neti pot.  Dayquil did help.  Sleeping ok at night.   Covid. Negative. Yest.   Review of Systems as above: See pertinent positives and pertinent negatives per HPI No acute distress verbally   Observations/Objective/Exam:  An attempt was made to discern vital signs over the phone and per patient if applicable and possible.   General:    Alert, Oriented, appears well and in no acute distress  Pulmonary:     On inspection no signs of respiratory distress.  Psych / Neurological:     Pleasant and cooperative.  Assessment and Plan:    ICD-10-CM   1. Viral syndrome  B34.9       Really sounds like the patient has some rhinosinusitis, viral syndrome.  Recommended that she continue to take some DayQuil, she can add NyQuil at nighttime if she needs to.  Tylenol if she needs to for achiness or headache.  I am gonna have her just watch this and continue with over-the-counter therapies through the weekend.  I did send her some doxycycline, so if things worsen over the next 4 or 5 days she can pick this up, but right now I would just leave this at the pharmacy.  I discussed the assessment and treatment plan with the patient. The patient was provided an opportunity to ask questions and all were answered. The patient agreed with the plan and demonstrated an understanding of the instructions.   The patient was advised to call back or seek an in-person evaluation if the symptoms worsen or if the condition fails to improve as anticipated.  Follow-up: prn unless noted otherwise below No follow-ups on file.  Meds ordered this encounter  Medications   doxycycline (VIBRA-TABS) 100 MG tablet    Sig: Take 1 tablet (100 mg total) by mouth 2 (two) times daily.    Dispense:  20 tablet    Refill:  0   No orders of the defined types were placed in this encounter.   Signed,  Elpidio Galea. Enzley Kitchens, MD

## 2021-07-25 ENCOUNTER — Ambulatory Visit: Payer: 59 | Admitting: Physician Assistant

## 2021-09-02 ENCOUNTER — Other Ambulatory Visit: Payer: Self-pay

## 2021-09-02 ENCOUNTER — Ambulatory Visit (INDEPENDENT_AMBULATORY_CARE_PROVIDER_SITE_OTHER): Payer: Self-pay | Admitting: Physician Assistant

## 2021-09-02 VITALS — BP 116/78 | HR 85 | Temp 97.8°F | Ht 67.0 in | Wt 215.2 lb

## 2021-09-02 DIAGNOSIS — R350 Frequency of micturition: Secondary | ICD-10-CM

## 2021-09-02 DIAGNOSIS — Z3040 Encounter for surveillance of contraceptives, unspecified: Secondary | ICD-10-CM

## 2021-09-02 LAB — POC URINALSYSI DIPSTICK (AUTOMATED)
Bilirubin, UA: NEGATIVE
Glucose, UA: NEGATIVE
Ketones, UA: NEGATIVE
Nitrite, UA: POSITIVE
Protein, UA: NEGATIVE
Spec Grav, UA: 1.015 (ref 1.010–1.025)
Urobilinogen, UA: 0.2 E.U./dL
pH, UA: 6.5 (ref 5.0–8.0)

## 2021-09-02 LAB — POCT URINE PREGNANCY: Preg Test, Ur: NEGATIVE

## 2021-09-02 MED ORDER — NORTREL 0.5/35 (28) 0.5-35 MG-MCG PO TABS: 1.0000 | ORAL_TABLET | Freq: Every day | ORAL | 5 refills | Status: DC

## 2021-09-02 MED ORDER — NITROFURANTOIN MONOHYD MACRO 100 MG PO CAPS: 100.0000 mg | ORAL_CAPSULE | Freq: Two times a day (BID) | ORAL | 0 refills | Status: AC

## 2021-09-02 NOTE — Progress Notes (Signed)
Subjective:    Patient ID: Lori Cross, female    DOB: October 17, 1980, 41 y.o.   MRN: [id number]  Chief Complaint  Patient presents with   Urinary Tract Infection   Contraception    HPI Patient is in today for discussion about possible UTI and starting back on birth control.  Last had OCPs in ?October last year. She ran out of coverage to pay for it. She was taking Nortrel. She did not have any issues with it. Without the medication, periods are irregular, heavy, painful. Not currently sexually active.  Also c/o frequency and odor with urination. No vaginal discharge. No back pain or fevers. No N/V/D. No abdominal pain. Drinks diet Pepsi often.   Past Medical History:  Diagnosis Date   Medical history non-contributory     Past Surgical History:  Procedure Laterality Date   NO PAST SURGERIES      Family History  Problem Relation Age of Onset   Diabetes Mother    Hypertension Mother    Diabetes Father    Hypertension Father     Social History   Tobacco Use   Smoking status: Never   Smokeless tobacco: Never  Vaping Use   Vaping Use: Never used  Substance Use Topics   Alcohol use: Yes    Comment: Rarely   Drug use: No     No Known Allergies  Review of Systems NEGATIVE UNLESS OTHERWISE INDICATED IN HPI      Objective:     BP 116/78    Pulse 85    Temp 97.8 F (36.6 C)    Ht 5\' 7"  (1.702 m)    Wt 215 lb 3.2 oz (97.6 kg)    LMP 09/02/2021    BMI 33.71 kg/m   Wt Readings from Last 3 Encounters:  09/02/21 215 lb 3.2 oz (97.6 kg)  05/29/21 217 lb 6.4 oz (98.6 kg)  05/09/21 216 lb 8 oz (98.2 kg)    BP Readings from Last 3 Encounters:  09/02/21 116/78  05/29/21 116/77  05/09/21 104/69     Physical Exam Vitals and nursing note reviewed.  Constitutional:      General: She is not in acute distress.    Appearance: Normal appearance. She is not ill-appearing.  HENT:     Head: Normocephalic and atraumatic.  Cardiovascular:     Rate and Rhythm: Normal  rate and regular rhythm.     Pulses: Normal pulses.     Heart sounds: Normal heart sounds.  Pulmonary:     Effort: Pulmonary effort is normal.     Breath sounds: Normal breath sounds.  Abdominal:     General: Abdomen is flat. Bowel sounds are normal.     Palpations: Abdomen is soft.     Tenderness: There is no right CVA tenderness or left CVA tenderness.  Skin:    General: Skin is warm and dry.  Neurological:     General: No focal deficit present.     Mental Status: She is alert.  Psychiatric:        Mood and Affect: Mood normal.       Assessment & Plan:   Problem List Items Addressed This Visit   None Visit Diagnoses     Frequency of urination    -  Primary   Relevant Orders   POCT Urinalysis Dipstick (Automated) (Completed)   Urine Culture   Encounter for surveillance of contraceptives, unspecified contraceptive       Relevant Orders   POCT  urine pregnancy        Meds ordered this encounter  Medications   norethindrone-ethinyl estradiol (NORTREL 0.5/35, 28,) 0.5-35 MG-MCG tablet    Sig: Take 1 tablet by mouth daily.    Dispense:  28 tablet    Refill:  5   nitrofurantoin, macrocrystal-monohydrate, (MACROBID) 100 MG capsule    Sig: Take 1 capsule (100 mg total) by mouth 2 (two) times daily for 7 days.    Dispense:  14 capsule    Refill:  0    1. Frequency of urination -UA positive for signs of infection Will send urine for culture and treat with Macrobid. Increase water intake. May take AZO for symptomatic relief. Recheck sooner if fever, severe back pain, vomiting, or other acutely worsening symptoms.    2. Encounter for surveillance of contraceptives, unspecified contraceptive -UPT neg -Will restart on Nortrel -R/B/A discussed with patient -She will schedule for well woman exam this summer with [place] or her GYN   Avid Guillette M Roshad Hack, PA-C

## 2021-09-04 LAB — URINE CULTURE
MICRO NUMBER:: 12875439
SPECIMEN QUALITY:: ADEQUATE

## 2021-09-18 ENCOUNTER — Other Ambulatory Visit: Payer: Self-pay | Admitting: Physician Assistant

## 2021-09-18 ENCOUNTER — Encounter: Payer: Self-pay | Admitting: Physician Assistant

## 2021-09-18 MED ORDER — FLUCONAZOLE 150 MG PO TABS: 150.0000 mg | ORAL_TABLET | Freq: Every day | ORAL | 0 refills | Status: AC

## 2021-11-21 ENCOUNTER — Encounter: Payer: Self-pay | Admitting: Physician Assistant

## 2022-02-17 ENCOUNTER — Ambulatory Visit
Admission: EM | Admit: 2022-02-17 | Discharge: 2022-02-17 | Disposition: A | Discharge status: Home / Self Care | Payer: Commercial Managed Care - HMO | Attending: Emergency Medicine | Admitting: Emergency Medicine

## 2022-02-17 DIAGNOSIS — N760 Acute vaginitis: Secondary | ICD-10-CM | POA: Insufficient documentation

## 2022-02-17 DIAGNOSIS — R8281 Pyuria: Secondary | ICD-10-CM | POA: Insufficient documentation

## 2022-02-17 DIAGNOSIS — N3090 Cystitis, unspecified without hematuria: Secondary | ICD-10-CM | POA: Diagnosis present

## 2022-02-17 DIAGNOSIS — Z113 Encounter for screening for infections with a predominantly sexual mode of transmission: Secondary | ICD-10-CM | POA: Insufficient documentation

## 2022-02-17 LAB — POCT URINALYSIS DIP (MANUAL ENTRY)
Bilirubin, UA: NEGATIVE
Glucose, UA: NEGATIVE mg/dL
Ketones, POC UA: NEGATIVE mg/dL
Nitrite, UA: NEGATIVE
Spec Grav, UA: 1.015 (ref 1.010–1.025)
Urobilinogen, UA: 0.2 E.U./dL
pH, UA: 6 (ref 5.0–8.0)

## 2022-02-17 LAB — POCT URINE PREGNANCY: Preg Test, Ur: NEGATIVE

## 2022-02-17 MED ORDER — SULFAMETHOXAZOLE-TRIMETHOPRIM 800-160 MG PO TABS: 1.0000 | ORAL_TABLET | Freq: Two times a day (BID) | ORAL | 0 refills | Status: AC

## 2022-02-17 MED ORDER — FLUCONAZOLE 150 MG PO TABS: ORAL_TABLET | ORAL | 0 refills | Status: DC

## 2022-02-17 NOTE — Discharge Instructions (Addendum)
The urinalysis that we performed in the clinic today was abnormal.  Urine culture will be performed per our protocol.  The result of the urine culture will be available in the next 3 to 5 days and will be posted to your MyChart account.  If there is an abnormal finding, you will be contacted by phone and advised of further treatment recommendations, if any.   You were advised to begin antibiotics today because you are having active symptoms of a urinary tract infection.  It is very important that you take all doses exactly as prescribed.  Incomplete antibiotic therapy can cause worsening urinary tract infection that can become aggressive, reach the level of your kidneys causing kidney infection and possible hospitalization.   If you have not had complete resolution of your symptoms after completing treatment as prescribed, please return to urgent care for repeat evaluation or follow-up with your primary care provider.  The results of your STD testing today will be made available to you once they are complete, this typically takes 3 to 5 days.  They will initially be posted to your MyChart and, if any of your results are abnormal, you will receive a phone call with those results along with further instructions regarding treatment and any prescriptions, if needed.  Please abstain from sexual intercourse of any kind until you have received the results of your STD testing.   Your urine pregnancy test today is negative.   Thank you for visiting urgent care today.  I appreciate the opportunity to participate in your care.

## 2022-02-17 NOTE — ED Provider Notes (Signed)
[facility]    CSN: [id number] Arrival date & time: 02/17/22  1850    HISTORY   Chief Complaint  Patient presents with   Vaginal Discharge   sti check    HPI Lori Cross is a 41 y.o. female. Patient presents to Urgent Care complaining of white vaginal discharge without vaginal pruritus over the past week. Patient reports slight fever and concern for uti, states she has noticed that she has been urinating more frequently, denies burning with urination, urine malodor.   The history is provided by the patient.   Past Medical History:  Diagnosis Date   Medical history non-contributory    Patient Active Problem List   Diagnosis Date Noted   Cystitis 05/29/2021   Obesity (BMI 30-39.9) 08/23/2014   Pyelonephritis 08/22/2014   Sepsis (HCC) 08/22/2014   UTI (lower urinary tract infection) 08/22/2014   Past Surgical History:  Procedure Laterality Date   NO PAST SURGERIES     OB History   No obstetric history on file.    Home Medications    Prior to Admission medications   Medication Sig Start Date End Date Taking? Authorizing Provider  doxycycline (VIBRA-TABS) 100 MG tablet Take 1 tablet (100 mg total) by mouth 2 (two) times daily. 07/18/21   Copland, Karleen Hampshire, MD  norethindrone-ethinyl estradiol (NORTREL 0.5/35, 28,) 0.5-35 MG-MCG tablet Take 1 tablet by mouth daily. 09/02/21   Allwardt, Crist Infante, PA-C  etonogestrel (IMPLANON) 68 MG IMPL implant Inject 1 each into the skin once.  10/17/19  [provider]   Family History Family History  Problem Relation Age of Onset   Diabetes Mother    Hypertension Mother    Diabetes Father    Hypertension Father    Social History Social History   Tobacco Use   Smoking status: Never   Smokeless tobacco: Never  Vaping Use   Vaping Use: Never used  Substance Use Topics   Alcohol use: Yes    Comment: Rarely   Drug use: No   Allergies   Patient has no known allergies.  Review of Systems Review of  Systems Pertinent findings noted in history of present illness.   Physical Exam Triage Vital Signs ED Triage Vitals  Enc Vitals Group     BP 06/14/21 0827 (!) 147/82     Pulse Rate 06/14/21 0827 72     Resp 06/14/21 0827 18     Temp 06/14/21 0827 98.3 F (36.8 C)     Temp Source 06/14/21 0827 Oral     SpO2 06/14/21 0827 98 %     Weight --      Height --      Head Circumference --      Peak Flow --      Pain Score 06/14/21 0826 5     Pain Loc --      Pain Edu? --      Excl. in GC? --   No data found.  Updated Vital Signs BP (!) 160/95   Pulse 89   Temp 99.3 F (37.4 C)   Resp 18   SpO2 98%   Physical Exam Vitals and nursing note reviewed.  Constitutional:      General: She is not in acute distress.    Appearance: Normal appearance. She is not ill-appearing.  HENT:     Head: Normocephalic and atraumatic.  Eyes:     General: Lids are normal.        Right eye: No discharge.  Left eye: No discharge.     Extraocular Movements: Extraocular movements intact.     Conjunctiva/sclera: Conjunctivae normal.     Right eye: Right conjunctiva is not injected.     Left eye: Left conjunctiva is not injected.  Neck:     Trachea: Trachea and phonation normal.  Cardiovascular:     Rate and Rhythm: Normal rate and regular rhythm.     Pulses: Normal pulses.     Heart sounds: Normal heart sounds. No murmur heard.    No friction rub. No gallop.  Pulmonary:     Effort: Pulmonary effort is normal. No accessory muscle usage, prolonged expiration or respiratory distress.     Breath sounds: Normal breath sounds. No stridor, decreased air movement or transmitted upper airway sounds. No decreased breath sounds, wheezing, rhonchi or rales.  Chest:     Chest wall: No tenderness.  Abdominal:     General: Abdomen is flat. Bowel sounds are normal.     Palpations: Abdomen is soft.     Tenderness: There is no abdominal tenderness.  Genitourinary:    Comments: Patient politely declines  pelvic exam today, patient provided a vaginal swab for testing. Musculoskeletal:        General: Normal range of motion.     Cervical back: Normal range of motion and neck supple. Normal range of motion.  Lymphadenopathy:     Cervical: No cervical adenopathy.  Skin:    General: Skin is warm and dry.     Findings: No erythema or rash.  Neurological:     General: No focal deficit present.     Mental Status: She is alert and oriented to person, place, and time.  Psychiatric:        Mood and Affect: Mood normal.        Behavior: Behavior normal.     Visual Acuity Right Eye Distance:   Left Eye Distance:   Bilateral Distance:    Right Eye Near:   Left Eye Near:    Bilateral Near:     UC Couse / Diagnostics / Procedures:    EKG  Radiology No results found.  Procedures Procedures (including critical care time)  UC Diagnoses / Final Clinical Impressions(s)   I have reviewed the triage vital signs and the nursing notes.  Pertinent labs & imaging results that were available during my care of the patient were reviewed by me and considered in my medical decision making (see chart for details).    Final diagnoses:  Pyuria  Cystitis   STD screening was performed, patient advised that the results be posted to their MyChart and if any of the results are positive, they will be notified by phone, further treatment will be provided as indicated based on results of STD screening. Urine pregnancy test was negative.  Urine dip today was positive.  Urine culture will be performed per our protocol.   Patient was advised to begin antibiotics now due to findings on urine dip. Patient was advised to begin antibiotics today due to having active symptoms of urinary tract infection.                    Patient advised that they will be contacted with results and that adjustments to treatment will be provided as indicated based on the results.   Patient was advised of possibility that urine  culture results may be negative if sample provided was obtained late in the day causing urine to be more diluted.  Patient was advised that if antibiotics were effective after the first 24 to 36 hours, despite negative urine culture result, it is recommended that they complete the full course as prescribed.   Return precautions advised.  ED Prescriptions     Medication Sig Dispense Auth. Provider   sulfamethoxazole-trimethoprim (BACTRIM DS) 800-160 MG tablet Take 1 tablet by mouth 2 (two) times daily for 5 days. 10 tablet Theadora Rama Scales, PA-C      PDMP not reviewed this encounter.  Pending results:  Labs Reviewed  POCT URINALYSIS DIP (MANUAL ENTRY) - Abnormal; Notable for the following components:      Result Value   Blood, UA moderate (*)    Protein Ur, POC trace (*)    Leukocytes, UA Moderate (2+) (*)    All other components within normal limits  URINE CULTURE  POCT URINE PREGNANCY  CERVICOVAGINAL ANCILLARY ONLY    Medications Ordered in UC: Medications - No data to display  Disposition Upon Discharge:  Condition: stable for discharge home  Patient presented with concern for an acute illness with associated systemic symptoms and significant discomfort requiring urgent management. In my opinion, this is a condition that a prudent lay person (someone who possesses an average knowledge of health and medicine) may potentially expect to result in complications if not addressed urgently such as respiratory distress, impairment of bodily function or dysfunction of bodily organs.   As such, the patient has been evaluated and assessed, work-up was performed and treatment was provided in alignment with urgent care protocols and evidence based medicine.  Patient/parent/caregiver has been advised that the patient may require follow up for further testing and/or treatment if the symptoms continue in spite of treatment, as clinically indicated and appropriate.  Routine symptom  specific, illness specific and/or disease specific instructions were discussed with the patient and/or caregiver at length.  Prevention strategies for avoiding STD exposure were also discussed.  The patient will follow up with their current PCP if and as advised. If the patient does not currently have a PCP we will assist them in obtaining one.   The patient may need specialty follow up if the symptoms continue, in spite of conservative treatment and management, for further workup, evaluation, consultation and treatment as clinically indicated and appropriate.  Patient/parent/caregiver verbalized understanding and agreement of plan as discussed.  All questions were addressed during visit.  Please see discharge instructions below for further details of plan.  Discharge Instructions:   Discharge Instructions      The urinalysis that we performed in the clinic today was abnormal.  Urine culture will be performed per our protocol.  The result of the urine culture will be available in the next 3 to 5 days and will be posted to your MyChart account.  If there is an abnormal finding, you will be contacted by phone and advised of further treatment recommendations, if any.   You were advised to begin antibiotics today because you are having active symptoms of a urinary tract infection.  It is very important that you take all doses exactly as prescribed.  Incomplete antibiotic therapy can cause worsening urinary tract infection that can become aggressive, reach the level of your kidneys causing kidney infection and possible hospitalization.   If you have not had complete resolution of your symptoms after completing treatment as prescribed, please return to urgent care for repeat evaluation or follow-up with your primary care provider.  The results of your STD testing today will be made available to  you once they are complete, this typically takes 3 to 5 days.  They will initially be posted to your MyChart  and, if any of your results are abnormal, you will receive a phone call with those results along with further instructions regarding treatment and any prescriptions, if needed.  Please abstain from sexual intercourse of any kind until you have received the results of your STD testing.   Your urine pregnancy test today is negative.   Thank you for visiting urgent care today.  I appreciate the opportunity to participate in your care.       This office note has been dictated using Teaching laboratory technician.  Unfortunately, and despite my best efforts, this method of dictation can sometimes lead to occasional typographical or grammatical errors.  I apologize in advance if this occurs.      Theadora Rama Scales, PA-C 02/17/22 1930

## 2022-02-17 NOTE — ED Triage Notes (Signed)
Patient presents to Urgent Care with complaints of vaginal discharge since last week. Patient reports slight fever and concern for uti.

## 2022-02-20 LAB — CERVICOVAGINAL ANCILLARY ONLY
Bacterial Vaginitis (gardnerella): POSITIVE — AB
Candida Glabrata: NEGATIVE
Candida Vaginitis: NEGATIVE
Chlamydia: NEGATIVE
Comment: NEGATIVE
Comment: NEGATIVE
Comment: NEGATIVE
Comment: NEGATIVE
Comment: NEGATIVE
Comment: NORMAL
Neisseria Gonorrhea: NEGATIVE
Trichomonas: NEGATIVE

## 2022-02-20 LAB — URINE CULTURE: Culture: <10000 — AB

## 2022-02-21 ENCOUNTER — Encounter: Payer: Self-pay | Admitting: Emergency Medicine

## 2022-02-21 ENCOUNTER — Telehealth (HOSPITAL_COMMUNITY): Payer: Self-pay | Admitting: Emergency Medicine

## 2022-02-21 ENCOUNTER — Ambulatory Visit
Admission: EM | Admit: 2022-02-21 | Discharge: 2022-02-21 | Disposition: A | Discharge status: Home / Self Care | Payer: Commercial Managed Care - HMO | Attending: Urgent Care | Admitting: Urgent Care

## 2022-02-21 DIAGNOSIS — B354 Tinea corporis: Secondary | ICD-10-CM

## 2022-02-21 DIAGNOSIS — B9689 Other specified bacterial agents as the cause of diseases classified elsewhere: Secondary | ICD-10-CM

## 2022-02-21 DIAGNOSIS — N760 Acute vaginitis: Secondary | ICD-10-CM

## 2022-02-21 MED ORDER — KETOCONAZOLE 2 % EX CREA: 1.0000 | TOPICAL_CREAM | Freq: Every day | CUTANEOUS | 0 refills | Status: DC

## 2022-02-21 MED ORDER — METRONIDAZOLE 0.75 % VA GEL: 1.0000 | Freq: Every day | VAGINAL | 0 refills | Status: AC

## 2022-02-21 MED ORDER — FLUCONAZOLE 150 MG PO TABS: 150.0000 mg | ORAL_TABLET | ORAL | 0 refills | Status: DC

## 2022-02-21 NOTE — ED Provider Notes (Signed)
[facility]   MRN: [id number] DOB: 1980/10/10  Subjective:   Lori Cross is a 41 y.o. female presenting for 2-day history of acute onset persistent rash that is very pruritic over the right inner side of her upper thigh.  No fever, tenderness, drainage of pus or bleeding.  No current facility-administered medications for this encounter.  Current Outpatient Medications:    fluconazole (DIFLUCAN) 150 MG tablet, Take 1 tablet on day 4 of antibiotics.  Take second tablet 3 days later., Disp: 2 tablet, Rfl: 0   metroNIDAZOLE (METROGEL VAGINAL) 0.75 % vaginal gel, Place 1 Applicatorful vaginally at bedtime for 5 days., Disp: 50 g, Rfl: 0   norethindrone-ethinyl estradiol (NORTREL 0.5/35, 28,) 0.5-35 MG-MCG tablet, Take 1 tablet by mouth daily., Disp: 28 tablet, Rfl: 5   sulfamethoxazole-trimethoprim (BACTRIM DS) 800-160 MG tablet, Take 1 tablet by mouth 2 (two) times daily for 5 days., Disp: 10 tablet, Rfl: 0   No Known Allergies  Past Medical History:  Diagnosis Date   Medical history non-contributory      Past Surgical History:  Procedure Laterality Date   NO PAST SURGERIES      Family History  Problem Relation Age of Onset   Diabetes Mother    Hypertension Mother    Diabetes Father    Hypertension Father     Social History   Tobacco Use   Smoking status: Never   Smokeless tobacco: Never  Vaping Use   Vaping Use: Never used  Substance Use Topics   Alcohol use: Yes    Comment: Rarely   Drug use: No    ROS   Objective:   Vitals: BP 124/81 (BP Location: Left Arm)   Pulse 88   Temp 99.1 F (37.3 C) (Oral)   Resp 20   SpO2 97%   Physical Exam Constitutional:      General: She is not in acute distress.    Appearance: Normal appearance. She is well-developed. She is not ill-appearing, toxic-appearing or diaphoretic.  HENT:     Head: Normocephalic and atraumatic.     Nose: Nose normal.     Mouth/Throat:     Mouth: Mucous  membranes are moist.  Eyes:     General: No scleral icterus.       Right eye: No discharge.        Left eye: No discharge.     Extraocular Movements: Extraocular movements intact.  Cardiovascular:     Rate and Rhythm: Normal rate.  Pulmonary:     Effort: Pulmonary effort is normal.  Skin:    General: Skin is warm and dry.       Neurological:     General: No focal deficit present.     Mental Status: She is alert and oriented to person, place, and time.  Psychiatric:        Mood and Affect: Mood normal.        Behavior: Behavior normal.      Assessment and Plan :   PDMP not reviewed this encounter.  1. Tinea corporis   2. Bacterial vaginosis    Recommended loading dose of fluconazole.  Use topical ketoconazole once daily for 2 weeks.  Patient is to maintain the recent prescription for metronidazole as she is getting treated for BV.  As such I recommended she take 1 dose of fluconazole after she finishes that antibiotic course.  Counseled patient on potential for adverse effects with medications prescribed/recommended today, ER and return-to-clinic precautions  discussed, patient verbalized understanding.    Wallis Bamberg, PA-C 02/21/22 1311

## 2022-02-21 NOTE — ED Triage Notes (Signed)
Pt here with a possible abscess on right side of groin x 2 days that she says is soft and has a black spot at the top of it. It is not painful, but itches.

## 2022-02-21 NOTE — Discharge Instructions (Addendum)
Please make sure you apply the ketoconazole cream once daily for 2 weeks.  Take 1 dose of the fluconazole tablet today and then again in 1 week.  Make sure you finish out your metronidazole treatment for BV.  It is okay to take all these medications together.

## 2022-03-13 ENCOUNTER — Other Ambulatory Visit: Payer: Self-pay | Admitting: Physician Assistant

## 2022-03-17 ENCOUNTER — Ambulatory Visit (HOSPITAL_COMMUNITY)
Admission: EM | Admit: 2022-03-17 | Discharge: 2022-03-17 | Disposition: A | Discharge status: Home / Self Care | Payer: Commercial Managed Care - HMO | Attending: Internal Medicine | Admitting: Internal Medicine

## 2022-03-17 ENCOUNTER — Encounter (HOSPITAL_COMMUNITY): Payer: Self-pay

## 2022-03-17 DIAGNOSIS — N898 Other specified noninflammatory disorders of vagina: Secondary | ICD-10-CM | POA: Diagnosis not present

## 2022-03-17 NOTE — ED Provider Notes (Signed)
[facility]    CSN: [id number] Arrival date & time: 03/17/22  1617      History   Chief Complaint Chief Complaint  Patient presents with   Vaginal Discharge    HPI Lori Cross is a 41 y.o. female.   Patient presents to urgent care for evaluation of vaginal discharge that is white and thick for the last week.  No vaginal itching, odor, or discomfort reported.  Last menstrual period started on March 09, 2022.  Patient was seen in urgent care on February 17, 2022 and prescribed Bactrim antibiotic for a suspected urinary tract infection.  Urine culture was negative and patient was instructed to stop the Bactrim antibiotic.  She was then seen on February 21, 2022 and prescribed metronidazole gel and Diflucan to treat suspected bacterial vaginitis and vaginal yeast infections.  Patient took medications as prescribed and then started her period.  When her period ended, her vaginal discharge came back.  Patient denies urinary frequency, dysuria, urgency, and fever/chills.  No abdominal pain, nausea, flank pain, low back pain, or dizziness reported.  Patient wears tight clothing and mostly wears cotton underwear.  She does not sleep with underwear on at night and denies douching.  She has not attempted use of any over-the-counter medications prior to arrival urgent care for her symptoms.   Vaginal Discharge   Past Medical History:  Diagnosis Date   Medical history non-contributory     Patient Active Problem List   Diagnosis Date Noted   Cystitis 05/29/2021   Obesity (BMI 30-39.9) 08/23/2014   Pyelonephritis 08/22/2014   Sepsis (HCC) 08/22/2014   UTI (lower urinary tract infection) 08/22/2014    Past Surgical History:  Procedure Laterality Date   NO PAST SURGERIES      OB History   No obstetric history on file.      Home Medications    Prior to Admission medications   Medication Sig Start Date End Date Taking? Authorizing Provider  NORTREL 0.5/35, 28, 0.5-35 MG-MCG  tablet TAKE 1 TABLET BY MOUTH EVERY DAY 03/13/22  Yes Allwardt, Alyssa M, PA-C  etonogestrel (IMPLANON) 68 MG IMPL implant Inject 1 each into the skin once.  10/17/19  [provider]    Family History Family History  Problem Relation Age of Onset   Diabetes Mother    Hypertension Mother    Diabetes Father    Hypertension Father     Social History Social History   Tobacco Use   Smoking status: Never   Smokeless tobacco: Never  Vaping Use   Vaping Use: Never used  Substance Use Topics   Alcohol use: Yes    Comment: Rarely   Drug use: No     Allergies   Patient has no known allergies.   Review of Systems Review of Systems  Genitourinary:  Positive for vaginal discharge.  Per HPI   Physical Exam Triage Vital Signs ED Triage Vitals  Enc Vitals Group     BP 03/17/22 1710 134/86     Pulse Rate 03/17/22 1710 86     Resp 03/17/22 1710 16     Temp 03/17/22 1710 98.5 F (36.9 C)     Temp Source 03/17/22 1710 Oral     SpO2 03/17/22 1710 100 %     Weight 03/17/22 1709 219 lb (99.3 kg)     Height 03/17/22 1709 5\' 7"  (1.702 m)     Head Circumference --      Peak Flow --  Pain Score 03/17/22 1708 0     Pain Loc --      Pain Edu? --      Excl. in GC? --    No data found.  Updated Vital Signs BP 134/86 (BP Location: Right Arm)   Pulse 86   Temp 98.5 F (36.9 C) (Oral)   Resp 16   Ht 5\' 7"  (1.702 m)   Wt 219 lb (99.3 kg)   LMP 03/09/2022 (Exact Date)   SpO2 100%   BMI 34.30 kg/m   Visual Acuity Right Eye Distance:   Left Eye Distance:   Bilateral Distance:    Right Eye Near:   Left Eye Near:    Bilateral Near:     Physical Exam Vitals and nursing note reviewed.  Constitutional:      Appearance: Normal appearance. She is not ill-appearing or toxic-appearing.     Comments: Very pleasant patient sitting on exam in position of comfort table in no acute distress.   HENT:     Head: Normocephalic and atraumatic.     Right Ear: Hearing and  external ear normal.     Left Ear: Hearing and external ear normal.     Nose: Nose normal.     Mouth/Throat:     Lips: Pink.     Mouth: Mucous membranes are moist.  Eyes:     General: Lids are normal. Vision grossly intact. Gaze aligned appropriately.     Extraocular Movements: Extraocular movements intact.     Conjunctiva/sclera: Conjunctivae normal.  Pulmonary:     Effort: Pulmonary effort is normal.  Abdominal:     General: Bowel sounds are normal.     Palpations: Abdomen is soft.     Tenderness: There is no abdominal tenderness. There is no right CVA tenderness, left CVA tenderness or guarding.  Musculoskeletal:     Cervical back: Neck supple.  Skin:    General: Skin is warm and dry.     Capillary Refill: Capillary refill takes less than 2 seconds.     Findings: No rash.  Neurological:     General: No focal deficit present.     Mental Status: She is alert and oriented to person, place, and time. Mental status is at baseline.     Cranial Nerves: No dysarthria or facial asymmetry.     Gait: Gait is intact.  Psychiatric:        Mood and Affect: Mood normal.        Speech: Speech normal.        Behavior: Behavior normal.        Thought Content: Thought content normal.        Judgment: Judgment normal.      UC Treatments / Results  Labs (all labs ordered are listed, but only abnormal results are displayed) Labs Reviewed  CERVICOVAGINAL ANCILLARY ONLY    EKG   Radiology No results found.  Procedures Procedures (including critical care time)  Medications Ordered in UC Medications - No data to display  Initial Impression / Assessment and Plan / UC Course  I have reviewed the triage vital signs and the nursing notes.  Pertinent labs & imaging results that were available during my care of the patient were reviewed by me and considered in my medical decision making (see chart for details).  STI labs pending.  Patient declines HIV and syphilis testing today.  Will  notify patient of positive results and treat accordingly when labs come back.  Patient to avoid  sexual intercourse until screening testing comes back.  Education provided regarding safe sexual practices and patient encouraged to use protection to prevent spread of STIs.  Suspect possible recurrent bacterial vaginosis cause for patient's symptoms.  Will treat based on STI labs.  Patient requests empiric treatment for vaginal yeast infection in the future due to frequency of vaginal candidiasis with antibiotic use.   Discussed physical exam and available lab work findings in clinic with patient.  Counseled patient regarding appropriate use of medications and potential side effects for all medications recommended or prescribed today. Discussed red flag signs and symptoms of worsening condition,when to call the PCP office, return to urgent care, and when to seek higher level of care in the emergency department. Patient verbalizes understanding and agreement with plan. All questions answered. Patient discharged in stable condition.  Final Clinical Impressions(s) / UC Diagnoses   Final diagnoses:  Vaginal discharge     Discharge Instructions      Your STD testing has been sent to the lab and will come back in the next 2 to 3 days.  We will call you if any of your results are positive requiring treatment and treat you at that time.   Tips for preventing recurrent bacterial vaginitis: Stop washing genital area with anything but plain water.  Let genital area air out at night and sleep without underpants on. Wear cotton underpants and change them midday if needed.  This will help to prevent moisture to the general area.  Avoid fragranced laundry detergents and use Free and clear detergents.  Return to urgent care as needed and follow-up with your primary care provider for further evaluation and management of your symptoms. I hope you feel better!     ED Prescriptions   None    PDMP not reviewed  this encounter.   Carlisle Beers, [place] 03/17/22 1825

## 2022-03-17 NOTE — ED Triage Notes (Signed)
Onset of vaginal discharge for one week. States she was treated with Bactrim for BV. Patient now having white discharge, no itching, slight odor.

## 2022-03-17 NOTE — Discharge Instructions (Addendum)
Your STD testing has been sent to the lab and will come back in the next 2 to 3 days.  We will call you if any of your results are positive requiring treatment and treat you at that time.   Tips for preventing recurrent bacterial vaginitis: Stop washing genital area with anything but plain water.  Let genital area air out at night and sleep without underpants on. Wear cotton underpants and change them midday if needed.  This will help to prevent moisture to the general area.  Avoid fragranced laundry detergents and use Free and clear detergents.  Return to urgent care as needed and follow-up with your primary care provider for further evaluation and management of your symptoms. I hope you feel better!

## 2022-03-18 ENCOUNTER — Telehealth (HOSPITAL_COMMUNITY): Payer: Self-pay | Admitting: Emergency Medicine

## 2022-03-18 LAB — CERVICOVAGINAL ANCILLARY ONLY
Bacterial Vaginitis (gardnerella): POSITIVE — AB
Candida Glabrata: NEGATIVE
Candida Vaginitis: NEGATIVE
Chlamydia: NEGATIVE
Comment: NEGATIVE
Comment: NEGATIVE
Comment: NEGATIVE
Comment: NEGATIVE
Comment: NEGATIVE
Comment: NORMAL
Neisseria Gonorrhea: NEGATIVE
Trichomonas: NEGATIVE

## 2022-03-18 MED ORDER — CLINDAMYCIN HCL 150 MG PO CAPS: 300.0000 mg | ORAL_CAPSULE | Freq: Two times a day (BID) | ORAL | 0 refills | Status: AC

## 2022-03-19 ENCOUNTER — Ambulatory Visit: Payer: Commercial Managed Care - HMO | Admitting: Physician Assistant

## 2022-05-12 ENCOUNTER — Encounter: Payer: Self-pay | Admitting: *Deleted

## 2022-06-20 ENCOUNTER — Encounter: Payer: Self-pay | Admitting: Physician Assistant

## 2022-06-20 ENCOUNTER — Ambulatory Visit (INDEPENDENT_AMBULATORY_CARE_PROVIDER_SITE_OTHER): Payer: Commercial Managed Care - HMO | Admitting: Physician Assistant

## 2022-06-20 VITALS — BP 142/84 | HR 88 | Temp 97.7°F | Ht 67.0 in | Wt 219.4 lb

## 2022-06-20 DIAGNOSIS — Z0000 Encounter for general adult medical examination without abnormal findings: Secondary | ICD-10-CM

## 2022-06-20 DIAGNOSIS — N920 Excessive and frequent menstruation with regular cycle: Secondary | ICD-10-CM

## 2022-06-20 DIAGNOSIS — Z23 Encounter for immunization: Secondary | ICD-10-CM | POA: Diagnosis not present

## 2022-06-20 DIAGNOSIS — R030 Elevated blood-pressure reading, without diagnosis of hypertension: Secondary | ICD-10-CM

## 2022-06-20 DIAGNOSIS — M21611 Bunion of right foot: Secondary | ICD-10-CM | POA: Diagnosis not present

## 2022-06-20 DIAGNOSIS — Z13220 Encounter for screening for lipoid disorders: Secondary | ICD-10-CM | POA: Diagnosis not present

## 2022-06-20 DIAGNOSIS — Z131 Encounter for screening for diabetes mellitus: Secondary | ICD-10-CM

## 2022-06-20 LAB — CBC WITH DIFFERENTIAL/PLATELET
Basophils Absolute: 0.1 10*3/uL (ref 0.0–0.1)
Basophils Relative: 0.9 % (ref 0.0–3.0)
Eosinophils Absolute: 0.2 10*3/uL (ref 0.0–0.7)
Eosinophils Relative: 1.7 % (ref 0.0–5.0)
HCT: 37 % (ref 36.0–46.0)
Hemoglobin: 12.4 g/dL (ref 12.0–15.0)
Lymphocytes Relative: 26.8 % (ref 12.0–46.0)
Lymphs Abs: 3 10*3/uL (ref 0.7–4.0)
MCHC: 33.5 g/dL (ref 30.0–36.0)
MCV: 90.1 fl (ref 78.0–100.0)
Monocytes Absolute: 0.6 10*3/uL (ref 0.1–1.0)
Monocytes Relative: 5.5 % (ref 3.0–12.0)
Neutro Abs: 7.2 10*3/uL (ref 1.4–7.7)
Neutrophils Relative %: 65.1 % (ref 43.0–77.0)
Platelets: 348 10*3/uL (ref 150.0–400.0)
RBC: 4.1 Mil/uL (ref 3.87–5.11)
RDW: 13.5 % (ref 11.5–15.5)
WBC: 11 10*3/uL — ABNORMAL HIGH (ref 4.0–10.5)

## 2022-06-20 LAB — LIPID PANEL
Cholesterol: 186 mg/dL (ref 0–200)
HDL: 62 mg/dL (ref >39.00)
LDL Cholesterol: 109 mg/dL — ABNORMAL HIGH (ref 0–99)
NonHDL: 124.42
Total CHOL/HDL Ratio: 3
Triglycerides: 77 mg/dL (ref 0.0–149.0)
VLDL: 15.4 mg/dL (ref 0.0–40.0)

## 2022-06-20 LAB — COMPREHENSIVE METABOLIC PANEL
ALT: 20 U/L (ref 0–35)
AST: 20 U/L (ref 0–37)
Albumin: 4 g/dL (ref 3.5–5.2)
Alkaline Phosphatase: 50 U/L (ref 39–117)
BUN: 13 mg/dL (ref 6–23)
CO2: 26 mEq/L (ref 19–32)
Calcium: 9.4 mg/dL (ref 8.4–10.5)
Chloride: 103 mEq/L (ref 96–112)
Creatinine, Ser: 0.84 mg/dL (ref 0.40–1.20)
GFR: 87.39 mL/min (ref >60.00)
Glucose, Bld: 83 mg/dL (ref 70–99)
Potassium: 4.1 mEq/L (ref 3.5–5.1)
Sodium: 136 mEq/L (ref 135–145)
Total Bilirubin: 0.3 mg/dL (ref 0.2–1.2)
Total Protein: 6.7 g/dL (ref 6.0–8.3)

## 2022-06-20 LAB — HEMOGLOBIN A1C: Hgb A1c MFr Bld: 5.5 % (ref 4.6–6.5)

## 2022-06-20 LAB — TSH: TSH: 0.64 u[IU]/mL (ref 0.35–5.50)

## 2022-06-20 NOTE — Patient Instructions (Addendum)
Labs today, treat pending any abnormal results.  Your blood pressure was elevated. Please monitor at home and recheck with me in about 4 weeks.  Need to work on dietary and lifestyle changes.   Try callus pads OTC for your feet. You do have a bunion on your R great toe. May need podiatry referral. Let me know if worse or not improving symptoms.   Call your GYN to schedule to discuss IUD or Nexplanon to help with cycles in place of birth control.

## 2022-06-20 NOTE — Progress Notes (Signed)
Subjective:    Patient ID: Lori Cross, female    DOB: 08/28/1980, 41 y.o.   MRN: [id number]  Chief Complaint  Patient presents with   Annual Exam    Pt in for annual CPE; and flu vaccine; pt is wanting to discuss changing birth control; pt c/o little toes on both feet h causes having a bunion/sore causes pain with shoes at times; pt not fasting for labs;     HPI Patient is in today for annual exam. Still having monthly cycles, last month noticed clotting. Still taking Nortrel OCP. Walks at work, but no extra exercise.  Marijuana use sparingly, 1-2 times per week.  No ETOH. No smoking or vaping. Needs to work on dietary changes.   Past Medical History:  Diagnosis Date   Medical history non-contributory     Past Surgical History:  Procedure Laterality Date   NO PAST SURGERIES      Family History  Problem Relation Age of Onset   Diabetes Mother    Hypertension Mother    Diabetes Father    Hypertension Father     Social History   Tobacco Use   Smoking status: Never   Smokeless tobacco: Never  Vaping Use   Vaping Use: Never used  Substance Use Topics   Alcohol use: Yes    Comment: Rarely   Drug use: No     No Known Allergies  Review of Systems NEGATIVE UNLESS OTHERWISE INDICATED IN HPI      Objective:     BP (!) 149/86 (BP Location: Right Arm)   Pulse 88   Temp 97.7 F (36.5 C) (Temporal)   Ht 5\' 7"  (1.702 m)   Wt 219 lb 6.4 oz (99.5 kg)   LMP 06/01/2022 (Exact Date)   SpO2 100%   BMI 34.36 kg/m   Wt Readings from Last 3 Encounters:  06/20/22 219 lb 6.4 oz (99.5 kg)  03/17/22 219 lb (99.3 kg)  09/02/21 215 lb 3.2 oz (97.6 kg)    BP Readings from Last 3 Encounters:  06/20/22 (!) 149/86  03/17/22 134/86  02/21/22 124/81     Physical Exam Vitals and nursing note reviewed.  Constitutional:      Appearance: Normal appearance. She is obese. She is not toxic-appearing.     Comments: Giggling, happy, marijuana scent  HENT:     Head:  Normocephalic and atraumatic.     Right Ear: Tympanic membrane, ear canal and external ear normal.     Left Ear: Tympanic membrane, ear canal and external ear normal.     Nose: Nose normal.     Mouth/Throat:     Mouth: Mucous membranes are moist.  Eyes:     Extraocular Movements: Extraocular movements intact.     Conjunctiva/sclera: Conjunctivae normal.     Pupils: Pupils are equal, round, and reactive to light.  Cardiovascular:     Rate and Rhythm: Normal rate and regular rhythm.     Pulses: Normal pulses.     Heart sounds: Normal heart sounds.  Pulmonary:     Effort: Pulmonary effort is normal.     Breath sounds: Normal breath sounds.  Abdominal:     General: Abdomen is flat. Bowel sounds are normal.     Palpations: Abdomen is soft.     Tenderness: There is no right CVA tenderness or left CVA tenderness.  Musculoskeletal:        General: Normal range of motion.     Cervical back: Normal range of  motion and neck supple.     Comments: Calluses noted pinky toes laterally Small bunion right great toe  Skin:    General: Skin is warm and dry.  Neurological:     General: No focal deficit present.     Mental Status: She is alert and oriented to person, place, and time.  Psychiatric:        Mood and Affect: Mood normal.        Behavior: Behavior normal.        Thought Content: Thought content normal.        Judgment: Judgment normal.        Assessment & Plan:  Encounter for annual physical exam -     CBC with Differential/Platelet -     Comprehensive metabolic panel -     Lipid panel -     TSH  Menorrhagia with regular cycle  Bunion of great toe of right foot  Diabetes mellitus screening -     Hemoglobin A1c  Screening for cholesterol level -     Lipid panel  Need for prophylactic vaccination with combined diphtheria-tetanus-pertussis (DTP) vaccine -     Tdap vaccine greater than or equal to 7yo IM  Need for immunization against influenza -     Flu Vaccine QUAD  86mo+IM (Fluarix, Fluzone & Alfiuria Quad PF)    Labs today, treat pending any abnormal results.  Your blood pressure was elevated. Please monitor at home and recheck with me in about 4 weeks.  Need to work on dietary and lifestyle changes.   Try callus pads OTC for your feet. You do have a bunion on your R great toe. May need podiatry referral. Let me know if worse or not improving symptoms.   Call your GYN to schedule to discuss IUD or Nexplanon to help with cycles in place of birth control.   Patient Counseling: [x]   Nutrition: Stressed importance of moderation in sodium/caffeine intake, saturated fat and cholesterol, caloric balance, sufficient intake of fresh fruits, vegetables, and fiber.  [x]   Stressed the importance of regular exercise.   [x]   Substance Abuse: Discussed cessation/primary prevention of tobacco, alcohol, or other drug use; driving or other dangerous activities under the influence; availability of treatment for abuse.   [x]   Injury prevention: Discussed safety belts, safety helmets, smoke detector, smoking near bedding or upholstery.   [x]   Sexuality: Discussed sexually transmitted diseases, partner selection, use of condoms, avoidance of unintended pregnancy  and contraceptive alternatives.   [x]   Dental health: Discussed importance of regular tooth brushing, flossing, and dental visits.  [x]   Health maintenance and immunizations reviewed. Please refer to Health maintenance section.       Return in about 4 weeks (around 07/18/2022) for recheck BP .    Roneshia Drew M Amori Colomb, PA-C

## 2022-06-23 ENCOUNTER — Telehealth: Payer: Self-pay | Admitting: Physician Assistant

## 2022-06-23 NOTE — Telephone Encounter (Signed)
Patient states she was returning a call in regards to her test results. Patient requests a callback when nurse is available.

## 2022-06-24 NOTE — Telephone Encounter (Signed)
Pt call returned, please see lab notes, pt spoke with [facility] and was advised lab results

## 2022-07-18 ENCOUNTER — Encounter: Payer: Self-pay | Admitting: Physician Assistant

## 2022-07-18 ENCOUNTER — Ambulatory Visit (INDEPENDENT_AMBULATORY_CARE_PROVIDER_SITE_OTHER): Payer: Commercial Managed Care - HMO | Admitting: Physician Assistant

## 2022-07-18 VITALS — BP 132/86 | HR 90 | Temp 97.3°F | Ht 67.0 in | Wt 220.4 lb

## 2022-07-18 DIAGNOSIS — H43399 Other vitreous opacities, unspecified eye: Secondary | ICD-10-CM

## 2022-07-18 DIAGNOSIS — R030 Elevated blood-pressure reading, without diagnosis of hypertension: Secondary | ICD-10-CM | POA: Diagnosis not present

## 2022-07-18 NOTE — Progress Notes (Signed)
   Subjective:    Patient ID: Lori Cross, female    DOB: 12-12-1980, 41 y.o.   MRN: [id number]  Chief Complaint  Patient presents with   Hypertension    Pt is in for BP recheck things are going well; pt c/o vision issues like white dots, floaters etc;     HPI Patient is in today for blood pressure recheck. Doesn't have a way to check at home. No symptoms.   Sees white floaters sometimes, especially with yawning. No dizziness or lightheadedness. Rare headaches. Glasses Rx is up to date, goes to eye Dr next month.   Past Medical History:  Diagnosis Date   Medical history non-contributory     Past Surgical History:  Procedure Laterality Date   NO PAST SURGERIES      Family History  Problem Relation Age of Onset   Diabetes Mother    Hypertension Mother    Diabetes Father    Hypertension Father    Thyroid disease Sister    Hypertension Maternal Grandfather    Hypertension Maternal Uncle     Social History   Tobacco Use   Smoking status: Never   Smokeless tobacco: Never  Vaping Use   Vaping Use: Never used  Substance Use Topics   Alcohol use: Yes    Comment: Rarely   Drug use: No     No Known Allergies  Review of Systems NEGATIVE UNLESS OTHERWISE INDICATED IN HPI      Objective:     BP 132/86 (BP Location: Left Arm)   Pulse 90   Temp (!) 97.3 F (36.3 C) (Temporal)   Ht 5\' 7"  (1.702 m)   Wt 220 lb 6.4 oz (100 kg)   LMP 07/09/2022 (Approximate)   SpO2 99%   BMI 34.52 kg/m   Wt Readings from Last 3 Encounters:  07/18/22 220 lb 6.4 oz (100 kg)  06/20/22 219 lb 6.4 oz (99.5 kg)  03/17/22 219 lb (99.3 kg)    BP Readings from Last 3 Encounters:  07/18/22 132/86  06/20/22 (!) 142/84  03/17/22 134/86     Physical Exam Constitutional:      Appearance: Normal appearance.  Eyes:     Extraocular Movements: Extraocular movements intact.     Conjunctiva/sclera: Conjunctivae normal.     Pupils: Pupils are equal, round, and reactive to light.   Cardiovascular:     Rate and Rhythm: Normal rate and regular rhythm.     Pulses: Normal pulses.     Heart sounds: No murmur heard. Pulmonary:     Effort: Pulmonary effort is normal.     Breath sounds: Normal breath sounds.  Neurological:     Mental Status: She is alert.  Psychiatric:        Mood and Affect: Mood normal.        Assessment & Plan:  Elevated blood pressure reading  Vitreous floaters, unspecified laterality   Blood pressure is normal reading today, diastolic borderline. Encouraged ways to keep blood pressure normal. Lifestyle changes advised in chart.  She will have eye exam next month & discuss possible floaters with her eye dr then.   Call sooner if any concerns.      Return in about 11 months (around 06/19/2023) for CPE, fasting labs.  This note was prepared with assistance of 13/08/2022. Occasional wrong-word or sound-a-like substitutions may have occurred due to the inherent limitations of voice recognition software.     Ashyra Cantin M Xinyi Batton, PA-C

## 2022-08-29 ENCOUNTER — Telehealth: Payer: Self-pay | Admitting: Physician Assistant

## 2022-08-29 NOTE — Telephone Encounter (Signed)
  Encourage patient to contact the pharmacy for refills or they can request refills through [place]:  Please schedule appointment if longer than 1 year  NEXT APPOINTMENT DATE: 06/26/23  MEDICATION:   NORTREL 0.5/35, 28, 0.5-35 MG-MCG tablet   Is the patient out of medication? YES  PHARMACY:   [id number] - [place], [place] - [address] AT [place] Phone: [phone]  Fax: [phone]      Let patient know to contact pharmacy at the end of the day to make sure medication is ready.  Please notify patient to allow 48-72 hours to process

## 2022-09-01 ENCOUNTER — Other Ambulatory Visit: Payer: Self-pay

## 2022-09-01 MED ORDER — NORTREL 0.5/35 (28) 0.5-35 MG-MCG PO TABS: 1.0000 | ORAL_TABLET | Freq: Every day | ORAL | 5 refills | Status: DC

## 2022-09-01 NOTE — Telephone Encounter (Signed)
Rx sent to pharmacy   

## 2022-09-04 ENCOUNTER — Other Ambulatory Visit (HOSPITAL_BASED_OUTPATIENT_CLINIC_OR_DEPARTMENT_OTHER): Payer: Self-pay

## 2022-09-04 MED ORDER — COMIRNATY 30 MCG/0.3ML IM SUSY
PREFILLED_SYRINGE | INTRAMUSCULAR | 0 refills | Status: DC
  Filled 2022-09-04: qty 0.3, 1d supply, fill #0

## 2022-09-15 ENCOUNTER — Other Ambulatory Visit (HOSPITAL_BASED_OUTPATIENT_CLINIC_OR_DEPARTMENT_OTHER): Payer: Self-pay

## 2022-10-24 ENCOUNTER — Encounter: Payer: Self-pay | Admitting: Physician Assistant

## 2022-10-24 ENCOUNTER — Ambulatory Visit (INDEPENDENT_AMBULATORY_CARE_PROVIDER_SITE_OTHER): Payer: Medicaid Other | Admitting: Family

## 2022-10-24 ENCOUNTER — Encounter: Payer: Self-pay | Admitting: Family

## 2022-10-24 VITALS — BP 145/91 | HR 87 | Temp 98.0°F | Ht 67.0 in | Wt 229.1 lb

## 2022-10-24 DIAGNOSIS — L84 Corns and callosities: Secondary | ICD-10-CM | POA: Diagnosis not present

## 2022-10-24 NOTE — Progress Notes (Signed)
   Patient ID: Lori Cross, female    DOB: Oct 14, 1980, 42 y.o.   MRN: [id number]  Chief Complaint  Patient presents with   Foot Swelling    Pt c/o sore on right foot pinky toe, Noticed a couple months ago but is getting worse. Has tried corn pads which did not help.     HPI:      Sore on toe:  pt reports she has been treating a corn on the outside of her pinky toe on right foot for about 2 mos. She has had to do the same for her left foot, same toe, in the past and the corn pads helped it heal, but they are not helping this time. Pt thinks the corn has gotten bigger and it is very painful to touch.      Assessment & Plan:  1. Corn of toe - advised pt to stop using the medicated corn pads, & advised to Look for the round pads with the center cut out and use these to alleviate pressure, advised on how to use. Advised ok to soak foot for 15-51min in warm epsom salt water qd. Need to wear wide shoes, no shoes that put pressure on the toe. Sending to Podiatry.  - Ambulatory referral to Podiatry  Subjective:    Outpatient Medications Prior to Visit  Medication Sig Dispense Refill   COVID-19 mRNA vaccine 2023-2024 (COMIRNATY) syringe Inject into the muscle. 0.3 mL 0   norethindrone-ethinyl estradiol (NORTREL 0.5/35, 28,) 0.5-35 MG-MCG tablet Take 1 tablet by mouth daily. 28 tablet 5   No facility-administered medications prior to visit.   Past Medical History:  Diagnosis Date   Medical history non-contributory    Past Surgical History:  Procedure Laterality Date   NO PAST SURGERIES     No Known Allergies    Objective:    Physical Exam Vitals and nursing note reviewed.  Constitutional:      Appearance: Normal appearance.  Cardiovascular:     Rate and Rhythm: Normal rate and regular rhythm.  Pulmonary:     Effort: Pulmonary effort is normal.     Breath sounds: Normal breath sounds.  Musculoskeletal:        General: Normal range of motion.  Feet:     Right foot:     Skin  integrity: Callus (corn on outer right pinky toe, approx 0.4cm in diameter, very tender to palpation) present.  Skin:    General: Skin is warm and dry.  Neurological:     Mental Status: She is alert.  Psychiatric:        Mood and Affect: Mood normal.        Behavior: Behavior normal.    BP (!) 145/91 (BP Location: Left Arm, Patient Position: Sitting, Cuff Size: Large)   Pulse 87   Temp 98 F (36.7 C) (Temporal)   Ht 5\' 7"  (1.702 m)   Wt 229 lb 2 oz (103.9 kg)   LMP 10/05/2022 (Exact Date)   SpO2 100%   BMI 35.89 kg/m  Wt Readings from Last 3 Encounters:  10/24/22 229 lb 2 oz (103.9 kg)  07/18/22 220 lb 6.4 oz (100 kg)  06/20/22 219 lb 6.4 oz (99.5 kg)       Jeanie Sewer, NP

## 2022-10-27 ENCOUNTER — Ambulatory Visit: Payer: Commercial Managed Care - HMO | Admitting: Family

## 2022-10-30 ENCOUNTER — Ambulatory Visit (INDEPENDENT_AMBULATORY_CARE_PROVIDER_SITE_OTHER): Payer: Commercial Managed Care - HMO | Admitting: Podiatry

## 2022-10-30 ENCOUNTER — Encounter: Payer: Self-pay | Admitting: Podiatry

## 2022-10-30 ENCOUNTER — Ambulatory Visit (INDEPENDENT_AMBULATORY_CARE_PROVIDER_SITE_OTHER): Payer: Commercial Managed Care - HMO

## 2022-10-30 DIAGNOSIS — M2041 Other hammer toe(s) (acquired), right foot: Secondary | ICD-10-CM | POA: Diagnosis not present

## 2022-10-30 DIAGNOSIS — M2040 Other hammer toe(s) (acquired), unspecified foot: Secondary | ICD-10-CM

## 2022-10-30 DIAGNOSIS — D2371 Other benign neoplasm of skin of right lower limb, including hip: Secondary | ICD-10-CM

## 2022-10-30 DIAGNOSIS — M79671 Pain in right foot: Secondary | ICD-10-CM | POA: Diagnosis not present

## 2022-10-30 NOTE — Progress Notes (Signed)
  Subjective:  Patient ID: Lori Cross, female    DOB: [birth year],  MRN: [id number] HPI Chief Complaint  Patient presents with   Toe Pain    5th toe/MPJ right - callused area and tender x couple months, intermittent-some days worse than others, tried peeling, using medicated pad  1st MPJ right - aching, developing bunion   New Patient (Initial Visit)    42 y.o. female presents with the above complaint.   ROS: Denies fever chills nausea vomit muscle aches pains calf pain back pain chest pain shortness of breath.  States that she is recently purchased new shoes and little bit tight across the toes.  Past Medical History:  Diagnosis Date   Medical history non-contributory    Past Surgical History:  Procedure Laterality Date   NO PAST SURGERIES      Current Outpatient Medications:    COVID-19 mRNA vaccine 2023-2024 (COMIRNATY) syringe, Inject into the muscle., Disp: 0.3 mL, Rfl: 0   norethindrone-ethinyl estradiol (NORTREL 0.5/35, 28,) 0.5-35 MG-MCG tablet, Take 1 tablet by mouth daily., Disp: 28 tablet, Rfl: 5  No Known Allergies Review of Systems Objective:  There were no vitals filed for this visit.  General: Well developed, nourished, in no acute distress, alert and oriented x3   Dermatological: Skin is warm, dry and supple bilateral. Nails x 10 are well maintained; remaining integument appears unremarkable at this time. There are no open sores, no preulcerative lesions, no rash or signs of infection present.  Painful corn callus overlying the lateral aspect of the fifth metatarsal head of the right foot and the fifth PIPJ right foot.  Vascular: Dorsalis Pedis artery and Posterior Tibial artery pedal pulses are 2/4 bilateral with immedate capillary fill time. Pedal hair growth present. No varicosities and no lower extremity edema present bilateral.   Neruologic: Grossly intact via light touch bilateral. Vibratory intact via tuning fork bilateral. Protective threshold with  Semmes Wienstein monofilament intact to all pedal sites bilateral. Patellar and Achilles deep tendon reflexes 2+ bilateral. No Babinski or clonus noted bilateral.   Musculoskeletal: No gross boney pedal deformities bilateral. No pain, crepitus, or limitation noted with foot and ankle range of motion bilateral. Muscular strength 5/5 in all groups tested bilateral.  Gait: Unassisted, Nonantalgic.    Radiographs:  Radiographs taken today demonstrate osseously mature individual with good mineralization to the bone.  She has a mild adductovarus rotated fifth digit of the right foot resulting in a small exostosis to the head of the proximal phalanx fifth right.  Assessment & Plan:   Assessment: Painful corn callus fifth metatarsal right foot and fifth toe.  Plan: Debridement of painful benign skin lesions x 2 right foot.  Discussed stretching her shoes or purchasing a larger wider pair.     Grover Robinson T. [place], [place]

## 2023-01-14 ENCOUNTER — Ambulatory Visit
Admission: EM | Admit: 2023-01-14 | Discharge: 2023-01-14 | Disposition: A | Discharge status: Home / Self Care | Payer: Medicaid Other | Attending: Urgent Care | Admitting: Urgent Care

## 2023-01-14 DIAGNOSIS — B9689 Other specified bacterial agents as the cause of diseases classified elsewhere: Secondary | ICD-10-CM | POA: Insufficient documentation

## 2023-01-14 DIAGNOSIS — L02416 Cutaneous abscess of left lower limb: Secondary | ICD-10-CM | POA: Insufficient documentation

## 2023-01-14 DIAGNOSIS — N898 Other specified noninflammatory disorders of vagina: Secondary | ICD-10-CM | POA: Insufficient documentation

## 2023-01-14 DIAGNOSIS — N760 Acute vaginitis: Secondary | ICD-10-CM | POA: Insufficient documentation

## 2023-01-14 MED ORDER — DOXYCYCLINE HYCLATE 100 MG PO CAPS: 100.0000 mg | ORAL_CAPSULE | Freq: Two times a day (BID) | ORAL | 0 refills | Status: DC

## 2023-01-14 MED ORDER — METRONIDAZOLE 500 MG PO TABS: 500.0000 mg | ORAL_TABLET | Freq: Two times a day (BID) | ORAL | 0 refills | Status: DC

## 2023-01-14 MED ORDER — FLUCONAZOLE 150 MG PO TABS: 150.0000 mg | ORAL_TABLET | ORAL | 0 refills | Status: DC

## 2023-01-14 NOTE — ED Notes (Signed)
In with Mani, PA-C for exam 

## 2023-01-14 NOTE — ED Triage Notes (Signed)
Pt c/o abscess to left thigh started yesterday-also wants an ?resolved abscess to rectal area checked-NAD-steady gait

## 2023-01-14 NOTE — Discharge Instructions (Signed)
Start doxycycline for the next 7 days to address your resolving abscess infection of the left thigh.  Please make sure you are using warm compresses to the left thigh.  You can start taking metronidazole and fluconazole to address a recurrent bacterial vaginosis and yeast infection.  We will update you with your lab results tomorrow and any changes that might be necessary from these results.

## 2023-01-14 NOTE — ED Provider Notes (Signed)
[facility]  Note:  This document was prepared using Conservation officer, historic buildings and may include unintentional dictation errors.  MRN: [id number] DOB: November 27, 1980  Subjective:   Lori Cross is a 42 y.o. female presenting for 2 primary concerns.  1.  Has had an abscess of the left posterior thigh close to the buttock.  Area is painful, had drained.  She also had one near the anus.  Would like this area checked.  Has not noticed any particular drainage. 2.  Has had thick vaginal discharge.  Has a history of BV and yeast infection.  Would like to be covered for this.  No concern for STI but is not opposed to testing.  No urinary symptoms.   No current facility-administered medications for this encounter.  Current Outpatient Medications:    COVID-19 mRNA vaccine 2023-2024 (COMIRNATY) syringe, Inject into the muscle., Disp: 0.3 mL, Rfl: 0   norethindrone-ethinyl estradiol (NORTREL 0.5/35, 28,) 0.5-35 MG-MCG tablet, Take 1 tablet by mouth daily., Disp: 28 tablet, Rfl: 5   No Known Allergies  Past Medical History:  Diagnosis Date   Medical history non-contributory      Past Surgical History:  Procedure Laterality Date   NO PAST SURGERIES      Family History  Problem Relation Age of Onset   Diabetes Mother    Hypertension Mother    Diabetes Father    Hypertension Father    Thyroid disease Sister    Hypertension Maternal Grandfather    Hypertension Maternal Uncle     Social History   Tobacco Use   Smoking status: Never   Smokeless tobacco: Never  Vaping Use   Vaping Use: Never used  Substance Use Topics   Alcohol use: Yes    Comment: occ   Drug use: No    ROS   Objective:   Vitals: BP 128/85 (BP Location: Right Arm)   Pulse 90   Temp (!) 97.3 F (36.3 C) (Oral)   Resp 18   LMP 01/03/2023   SpO2 98%   Physical Exam Exam conducted with a chaperone present Banker Presnell).  Constitutional:      General: She is not in acute  distress.    Appearance: Normal appearance. She is well-developed. She is not ill-appearing, toxic-appearing or diaphoretic.  HENT:     Head: Normocephalic and atraumatic.     Nose: Nose normal.     Mouth/Throat:     Mouth: Mucous membranes are moist.  Eyes:     General: No scleral icterus.       Right eye: No discharge.        Left eye: No discharge.     Extraocular Movements: Extraocular movements intact.  Cardiovascular:     Rate and Rhythm: Normal rate.  Pulmonary:     Effort: Pulmonary effort is normal.  Genitourinary:      Comments: Thick vaginal discharge noted from the vaginal area. Skin:    General: Skin is warm and dry.  Neurological:     General: No focal deficit present.     Mental Status: She is alert and oriented to person, place, and time.  Psychiatric:        Mood and Affect: Mood normal.        Behavior: Behavior normal.     Assessment and Plan :   PDMP not reviewed this encounter.  1. Abscess of left thigh   2. Vaginal discharge   3. Bacterial vaginosis   4.  Acute vaginitis    Abscess not amenable to incision and drainage.  Recommended doxycycline, warm compresses.  Discussed general wound care.  We will treat patient empirically for bacterial vaginosis with Flagyl and for yeast vaginitis with fluconazole.  Labs pending. Counseled patient on potential for adverse effects with medications prescribed/recommended today, ER and return-to-clinic precautions discussed, patient verbalized understanding.    Wallis Bamberg, [place] 01/14/23 1628

## 2023-01-15 LAB — CERVICOVAGINAL ANCILLARY ONLY
Bacterial Vaginitis (gardnerella): NEGATIVE
Candida Glabrata: NEGATIVE
Candida Vaginitis: NEGATIVE
Chlamydia: NEGATIVE
Comment: NEGATIVE
Comment: NEGATIVE
Comment: NEGATIVE
Comment: NEGATIVE
Comment: NEGATIVE
Comment: NORMAL
Neisseria Gonorrhea: NEGATIVE
Trichomonas: NEGATIVE

## 2023-03-05 ENCOUNTER — Ambulatory Visit
Admission: EM | Admit: 2023-03-05 | Discharge: 2023-03-05 | Disposition: A | Discharge status: Home / Self Care | Payer: Medicaid Other | Attending: Internal Medicine | Admitting: Internal Medicine

## 2023-03-05 DIAGNOSIS — N3001 Acute cystitis with hematuria: Secondary | ICD-10-CM | POA: Diagnosis not present

## 2023-03-05 LAB — POCT URINALYSIS DIP (MANUAL ENTRY)
Bilirubin, UA: NEGATIVE
Glucose, UA: NEGATIVE mg/dL
Ketones, POC UA: NEGATIVE mg/dL
Nitrite, UA: POSITIVE — AB
Protein Ur, POC: NEGATIVE mg/dL
Spec Grav, UA: 1.02 (ref 1.010–1.025)
Urobilinogen, UA: 1 E.U./dL
pH, UA: 6 (ref 5.0–8.0)

## 2023-03-05 LAB — POCT URINE PREGNANCY: Preg Test, Ur: NEGATIVE

## 2023-03-05 MED ORDER — CEPHALEXIN 500 MG PO CAPS
500.0000 mg | ORAL_CAPSULE | Freq: Two times a day (BID) | ORAL | 0 refills | Status: AC
Start: 2023-03-05 — End: 2023-03-12

## 2023-03-05 NOTE — Discharge Instructions (Signed)
Your urine was positive for UTI.  We will send the urine for culture and contact you with those results if positive.  Please start Keflex twice daily for 7 days.  Lots of rest and fluids.  Follow-up with your PCP if your symptoms do not improve.  Please go to the ER for any worsening symptoms.  I hope you feel better soon!

## 2023-03-05 NOTE — ED Provider Notes (Signed)
[facility]    CSN: [id number] Arrival date & time: 03/05/23  1456      History   Chief Complaint No chief complaint on file.   HPI Lori Cross is a 41 y.o. female presents for evaluation of dysuria.  Patient reports 1 week of urinary urgency with suprapubic pressure.  She denies burning with urination, hematuria, frequency, fevers, nausea/vomiting, flank pain.  No vaginal discharge or STD concern.  She does have a history of UTIs.  She has not taken any OTC medications for symptoms.  She denies pregnancy or breast-feeding.  No other concerns at this time.  HPI  Past Medical History:  Diagnosis Date   Medical history non-contributory     Patient Active Problem List   Diagnosis Date Noted   Cystitis 05/29/2021   Obesity (BMI 30-39.9) 08/23/2014   Pyelonephritis 08/22/2014   Sepsis (HCC) 08/22/2014   UTI (lower urinary tract infection) 08/22/2014    Past Surgical History:  Procedure Laterality Date   NO PAST SURGERIES      OB History   No obstetric history on file.      Home Medications    Prior to Admission medications   Medication Sig Start Date End Date Taking? Authorizing Provider  cephALEXin (KEFLEX) 500 MG capsule Take 1 capsule (500 mg total) by mouth 2 (two) times daily for 7 days. 03/05/23 03/12/23 Yes Radford Pax, NP  COVID-19 mRNA vaccine [phone] (COMIRNATY) syringe Inject into the muscle. 09/04/22     fluconazole (DIFLUCAN) 150 MG tablet Take 1 tablet (150 mg total) by mouth every 3 (three) days. 01/14/23   Wallis Bamberg, PA-C  metroNIDAZOLE (FLAGYL) 500 MG tablet Take 1 tablet (500 mg total) by mouth 2 (two) times daily with a meal. DO NOT CONSUME ALCOHOL WHILE TAKING THIS MEDICATION. 01/14/23   Wallis Bamberg, PA-C  norethindrone-ethinyl estradiol (NORTREL 0.5/35, 28,) 0.5-35 MG-MCG tablet Take 1 tablet by mouth daily. 09/01/22   Allwardt, Crist Infante, PA-C  etonogestrel (IMPLANON) 68 MG IMPL implant Inject 1 each into the skin once.  10/17/19   [provider]    Family History Family History  Problem Relation Age of Onset   Diabetes Mother    Hypertension Mother    Diabetes Father    Hypertension Father    Thyroid disease Sister    Hypertension Maternal Grandfather    Hypertension Maternal Uncle     Social History Social History   Tobacco Use   Smoking status: Never   Smokeless tobacco: Never  Vaping Use   Vaping status: Never Used  Substance Use Topics   Alcohol use: Yes    Comment: occ   Drug use: No     Allergies   Patient has no known allergies.   Review of Systems Review of Systems  Genitourinary:  Positive for dysuria.     Physical Exam Triage Vital Signs ED Triage Vitals  Encounter Vitals Group     BP 03/05/23 1516 (!) 140/84     Systolic BP Percentile --      Diastolic BP Percentile --      Pulse Rate 03/05/23 1516 87     Resp 03/05/23 1516 16     Temp 03/05/23 1516 (!) 97.3 F (36.3 C)     Temp Source 03/05/23 1516 Oral     SpO2 03/05/23 1516 98 %     Weight --      Height --      Head Circumference --  Peak Flow --      Pain Score 03/05/23 1518 0     Pain Loc --      Pain Education --      Exclude from Growth Chart --    No data found.  Updated Vital Signs BP (!) 140/84 (BP Location: Right Arm)   Pulse 87   Temp (!) 97.3 F (36.3 C) (Oral)   Resp 16   LMP 02/24/2023 (Approximate)   SpO2 98%   Visual Acuity Right Eye Distance:   Left Eye Distance:   Bilateral Distance:    Right Eye Near:   Left Eye Near:    Bilateral Near:     Physical Exam Vitals and nursing note reviewed.  Constitutional:      Appearance: Normal appearance.  HENT:     Head: Normocephalic and atraumatic.  Eyes:     Pupils: Pupils are equal, round, and reactive to light.  Cardiovascular:     Rate and Rhythm: Normal rate.  Pulmonary:     Effort: Pulmonary effort is normal.  Abdominal:     Tenderness: There is no right CVA tenderness or left CVA tenderness.  Skin:     General: Skin is warm and dry.  Neurological:     General: No focal deficit present.     Mental Status: She is alert and oriented to person, place, and time.  Psychiatric:        Mood and Affect: Mood normal.        Behavior: Behavior normal.      UC Treatments / Results  Labs (all labs ordered are listed, but only abnormal results are displayed) Labs Reviewed  POCT URINALYSIS DIP (MANUAL ENTRY) - Abnormal; Notable for the following components:      Result Value   Clarity, UA cloudy (*)    Blood, UA moderate (*)    Nitrite, UA Positive (*)    Leukocytes, UA Small (1+) (*)    All other components within normal limits  URINE CULTURE  POCT URINE PREGNANCY    EKG   Radiology No results found.  Procedures Procedures (including critical care time)  Medications Ordered in UC Medications - No data to display  Initial Impression / Assessment and Plan / UC Course  I have reviewed the triage vital signs and the nursing notes.  Pertinent labs & imaging results that were available during my care of the patient were reviewed by me and considered in my medical decision making (see chart for details).     UA positive for UTI, will culture.  Start Keflex twice daily for 7 days.  Lots of fluids and rest.  PCP follow-up if symptoms do not improve.  ER precautions reviewed and patient verbalized understanding. Final Clinical Impressions(s) / UC Diagnoses   Final diagnoses:  Acute cystitis with hematuria     Discharge Instructions      Your urine was positive for UTI.  We will send the urine for culture and contact you with those results if positive.  Please start Keflex twice daily for 7 days.  Lots of rest and fluids.  Follow-up with your PCP if your symptoms do not improve.  Please go to the ER for any worsening symptoms.  I hope you feel better soon!    ED Prescriptions     Medication Sig Dispense Auth. Provider   cephALEXin (KEFLEX) 500 MG capsule Take 1 capsule (500 mg  total) by mouth 2 (two) times daily for 7 days. 14 capsule Stacie [place],  Hipolito Bayley, NP      PDMP not reviewed this encounter.   Radford Pax, NP 03/05/23 1531

## 2023-03-05 NOTE — ED Triage Notes (Signed)
Pt presents to UC w/ c/o pressure when urinating, urgency x1 week.

## 2023-03-07 LAB — URINE CULTURE: Culture: >=100000 — AB

## 2023-03-08 LAB — URINE CULTURE

## 2023-03-29 ENCOUNTER — Ambulatory Visit
Admission: EM | Admit: 2023-03-29 | Discharge: 2023-03-29 | Disposition: A | Discharge status: Home / Self Care | Payer: Medicaid Other | Attending: Internal Medicine | Admitting: Internal Medicine

## 2023-03-29 DIAGNOSIS — N3001 Acute cystitis with hematuria: Secondary | ICD-10-CM | POA: Diagnosis not present

## 2023-03-29 LAB — POCT URINALYSIS DIP (MANUAL ENTRY)
Bilirubin, UA: NEGATIVE
Glucose, UA: NEGATIVE mg/dL
Ketones, POC UA: NEGATIVE mg/dL
Nitrite, UA: POSITIVE — AB
Protein Ur, POC: NEGATIVE mg/dL
Spec Grav, UA: 1.02 (ref 1.010–1.025)
Urobilinogen, UA: 0.2 E.U./dL
pH, UA: 6 (ref 5.0–8.0)

## 2023-03-29 LAB — POCT URINE PREGNANCY: Preg Test, Ur: NEGATIVE

## 2023-03-29 MED ORDER — SULFAMETHOXAZOLE-TRIMETHOPRIM 800-160 MG PO TABS
1.0000 | ORAL_TABLET | Freq: Two times a day (BID) | ORAL | 0 refills | Status: DC
Start: 2023-03-29 — End: 2023-04-01

## 2023-03-29 NOTE — ED Triage Notes (Signed)
Pt presents to UC w/ c/o pressure when urinating, slight dysuria x1 week.

## 2023-03-29 NOTE — Discharge Instructions (Addendum)
Start Bactrim twice daily for 7 days.  The clinic will contact you with results of the urine culture done today if positive.  Lots of rest and fluids.  Please follow-up with your PCP if your symptoms do not improve.  Please go to the emergency room for any worsening symptoms.  Hope you feel better soon!

## 2023-03-29 NOTE — ED Provider Notes (Signed)
[facility]    CSN: [id number] Arrival date & time: 03/29/23  1359      History   Chief Complaint No chief complaint on file.   HPI Lori Cross is a 42 y.o. female presents for evaluation of dysuria.  Patient reports 1 week of urinary frequency and urgency.  She denies dysuria, hematuria, fevers, nausea/vomiting, flank pain.  No vaginal discharge or STD concern.  She does have a history of recurrent UTI/treated in July with resolution of symptoms.  Urine culture did grow E. coli.  She is only been taking cranberry juice OTC for symptoms.  No other concerns at this time.  HPI  Past Medical History:  Diagnosis Date   Medical history non-contributory     Patient Active Problem List   Diagnosis Date Noted   Cystitis 05/29/2021   Obesity (BMI 30-39.9) 08/23/2014   Pyelonephritis 08/22/2014   Sepsis (HCC) 08/22/2014   UTI (lower urinary tract infection) 08/22/2014    Past Surgical History:  Procedure Laterality Date   NO PAST SURGERIES      OB History   No obstetric history on file.      Home Medications    Prior to Admission medications   Medication Sig Start Date End Date Taking? Authorizing Provider  sulfamethoxazole-trimethoprim (BACTRIM DS) 800-160 MG tablet Take 1 tablet by mouth 2 (two) times daily for 7 days. 03/29/23 04/05/23 Yes Radford Pax, NP  COVID-19 mRNA vaccine [phone] (COMIRNATY) syringe Inject into the muscle. 09/04/22     fluconazole (DIFLUCAN) 150 MG tablet Take 1 tablet (150 mg total) by mouth every 3 (three) days. 01/14/23   Wallis Bamberg, PA-C  metroNIDAZOLE (FLAGYL) 500 MG tablet Take 1 tablet (500 mg total) by mouth 2 (two) times daily with a meal. DO NOT CONSUME ALCOHOL WHILE TAKING THIS MEDICATION. 01/14/23   Wallis Bamberg, PA-C  norethindrone-ethinyl estradiol (NORTREL 0.5/35, 28,) 0.5-35 MG-MCG tablet Take 1 tablet by mouth daily. 09/01/22   Allwardt, Crist Infante, PA-C  etonogestrel (IMPLANON) 68 MG IMPL implant Inject 1 each into the  skin once.  10/17/19  [provider]    Family History Family History  Problem Relation Age of Onset   Diabetes Mother    Hypertension Mother    Diabetes Father    Hypertension Father    Thyroid disease Sister    Hypertension Maternal Grandfather    Hypertension Maternal Uncle     Social History Social History   Tobacco Use   Smoking status: Never   Smokeless tobacco: Never  Vaping Use   Vaping status: Never Used  Substance Use Topics   Alcohol use: Yes    Comment: occ   Drug use: No     Allergies   Patient has no known allergies.   Review of Systems Review of Systems  Genitourinary:  Positive for frequency and urgency.     Physical Exam Triage Vital Signs ED Triage Vitals  Encounter Vitals Group     BP 03/29/23 1407 112/73     Systolic BP Percentile --      Diastolic BP Percentile --      Pulse Rate 03/29/23 1407 (!) 108     Resp 03/29/23 1407 16     Temp 03/29/23 1407 98.3 F (36.8 C)     Temp Source 03/29/23 1407 Oral     SpO2 03/29/23 1407 98 %     Weight --      Height --      Head Circumference --  Peak Flow --      Pain Score 03/29/23 1411 0     Pain Loc --      Pain Education --      Exclude from Growth Chart --    No data found.  Updated Vital Signs BP 112/73 (BP Location: Right Arm)   Pulse (!) 108   Temp 98.3 F (36.8 C) (Oral)   Resp 16   LMP 03/24/2023 (Exact Date)   SpO2 98%   Visual Acuity Right Eye Distance:   Left Eye Distance:   Bilateral Distance:    Right Eye Near:   Left Eye Near:    Bilateral Near:     Physical Exam Vitals and nursing note reviewed.  Constitutional:      Appearance: Normal appearance.  HENT:     Head: Normocephalic and atraumatic.  Eyes:     Pupils: Pupils are equal, round, and reactive to light.  Cardiovascular:     Rate and Rhythm: Normal rate.  Pulmonary:     Effort: Pulmonary effort is normal.  Abdominal:     Tenderness: There is no right CVA tenderness or left CVA  tenderness.  Skin:    General: Skin is warm and dry.  Neurological:     General: No focal deficit present.     Mental Status: She is alert and oriented to person, place, and time.  Psychiatric:        Mood and Affect: Mood normal.        Behavior: Behavior normal.      UC Treatments / Results  Labs (all labs ordered are listed, but only abnormal results are displayed) Labs Reviewed  POCT URINALYSIS DIP (MANUAL ENTRY) - Abnormal; Notable for the following components:      Result Value   Clarity, UA turbid (*)    Blood, UA moderate (*)    Nitrite, UA Positive (*)    Leukocytes, UA Large (3+) (*)    All other components within normal limits  URINE CULTURE  POCT URINE PREGNANCY    EKG   Radiology No results found.  Procedures Procedures (including critical care time)  Medications Ordered in UC Medications - No data to display  Initial Impression / Assessment and Plan / UC Course  I have reviewed the triage vital signs and the nursing notes.  Pertinent labs & imaging results that were available during my care of the patient were reviewed by me and considered in my medical decision making (see chart for details).     Reviewed exam and symptoms with patient.  No red flags.  UA positive for UTI, will culture and start Bactrim.  PCP follow-up if symptoms do not improve.  ER precautions reviewed and patient verbalized understanding Final Clinical Impressions(s) / UC Diagnoses   Final diagnoses:  Acute cystitis with hematuria     Discharge Instructions      Start Bactrim twice daily for 7 days.  The clinic will contact you with results of the urine culture done today if positive.  Lots of rest and fluids.  Please follow-up with your PCP if your symptoms do not improve.  Please go to the emergency room for any worsening symptoms.  Hope you feel better soon!   ED Prescriptions     Medication Sig Dispense Auth. Provider   sulfamethoxazole-trimethoprim (BACTRIM DS)  800-160 MG tablet Take 1 tablet by mouth 2 (two) times daily for 7 days. 14 tablet Radford Pax, NP      PDMP not  reviewed this encounter.   Radford Pax, NP 03/29/23 1423

## 2023-04-01 ENCOUNTER — Telehealth (HOSPITAL_COMMUNITY): Payer: Self-pay | Admitting: Emergency Medicine

## 2023-04-01 MED ORDER — NITROFURANTOIN MONOHYD MACRO 100 MG PO CAPS: 100.0000 mg | ORAL_CAPSULE | Freq: Two times a day (BID) | ORAL | 0 refills | Status: DC

## 2023-04-01 MED ORDER — FLUCONAZOLE 150 MG PO TABS: 150.0000 mg | ORAL_TABLET | ORAL | 0 refills | Status: DC

## 2023-04-01 NOTE — Telephone Encounter (Signed)
PAtient requested antibiotic associated yeast medicine Reviewed with patient, verified pharmacy, prescription sent Sent per protocol

## 2023-04-22 ENCOUNTER — Ambulatory Visit
Admission: EM | Admit: 2023-04-22 | Discharge: 2023-04-22 | Disposition: A | Discharge status: Home / Self Care | Payer: Medicaid Other | Attending: Internal Medicine | Admitting: Internal Medicine

## 2023-04-22 DIAGNOSIS — R509 Fever, unspecified: Secondary | ICD-10-CM | POA: Insufficient documentation

## 2023-04-22 DIAGNOSIS — B349 Viral infection, unspecified: Secondary | ICD-10-CM | POA: Diagnosis present

## 2023-04-22 DIAGNOSIS — U071 COVID-19: Secondary | ICD-10-CM | POA: Insufficient documentation

## 2023-04-22 MED ORDER — ACETAMINOPHEN 325 MG PO TABS
650.0000 mg | ORAL_TABLET | Freq: Once | ORAL | Status: AC
  Administered 2023-04-22: 650 mg via ORAL

## 2023-04-22 MED ORDER — CETIRIZINE HCL 10 MG PO TABS: 10.0000 mg | ORAL_TABLET | Freq: Every day | ORAL | 0 refills | Status: DC

## 2023-04-22 MED ORDER — IBUPROFEN 800 MG PO TABS
800.0000 mg | ORAL_TABLET | Freq: Once | ORAL | Status: AC
  Administered 2023-04-22: 800 mg via ORAL

## 2023-04-22 MED ORDER — PROMETHAZINE-DM 6.25-15 MG/5ML PO SYRP: 5.0000 mL | ORAL_SOLUTION | Freq: Three times a day (TID) | ORAL | 0 refills | Status: DC | PRN

## 2023-04-22 MED ORDER — PSEUDOEPHEDRINE HCL 30 MG PO TABS: 30.0000 mg | ORAL_TABLET | Freq: Three times a day (TID) | ORAL | 0 refills | Status: DC | PRN

## 2023-04-22 NOTE — ED Triage Notes (Signed)
Pt reports fever 101.5 F and cough  x 2 days. Pt has not taken any meds.

## 2023-04-22 NOTE — ED Provider Notes (Signed)
[facility]  Note:  This document was prepared using Conservation officer, historic buildings and may include unintentional dictation errors.  MRN: [id number] DOB: [birth year]  Subjective:   Lori Cross is a 42 y.o. female presenting for 2-day history of acute onset fever, body pains, coughing.  Had very close exposure to a person that tested positive for COVID-19.  No smoking of any kind including cigarettes, cigars, vaping, marijuana use.  No asthma, respiratory disorder.  No active chest pain, shortness of breath or wheezing.  No current facility-administered medications for this encounter.  Current Outpatient Medications:    COVID-19 mRNA vaccine 2023-2024 (COMIRNATY) syringe, Inject into the muscle., Disp: 0.3 mL, Rfl: 0   fluconazole (DIFLUCAN) 150 MG tablet, Take 1 tablet (150 mg total) by mouth every 3 (three) days., Disp: 3 tablet, Rfl: 0   nitrofurantoin, macrocrystal-monohydrate, (MACROBID) 100 MG capsule, Take 1 capsule (100 mg total) by mouth 2 (two) times daily., Disp: 10 capsule, Rfl: 0   norethindrone-ethinyl estradiol (NORTREL 0.5/35, 28,) 0.5-35 MG-MCG tablet, Take 1 tablet by mouth daily., Disp: 28 tablet, Rfl: 5   No Known Allergies  Past Medical History:  Diagnosis Date   Medical history non-contributory      Past Surgical History:  Procedure Laterality Date   NO PAST SURGERIES      Family History  Problem Relation Age of Onset   Diabetes Mother    Hypertension Mother    Diabetes Father    Hypertension Father    Thyroid disease Sister    Hypertension Maternal Grandfather    Hypertension Maternal Uncle     Social History   Tobacco Use   Smoking status: Never   Smokeless tobacco: Never  Vaping Use   Vaping status: Never Used  Substance Use Topics   Alcohol use: Yes    Comment: occ   Drug use: No    ROS   Objective:   Vitals: BP (!) 142/94 (BP Location: Right Arm)   Pulse 99   Temp (!) 102.4 F (39.1 C) (Oral)    Resp 18   LMP 04/22/2023 (Exact Date)   SpO2 96%   Physical Exam Constitutional:      General: She is not in acute distress.    Appearance: Normal appearance. She is well-developed and normal weight. She is not ill-appearing, toxic-appearing or diaphoretic.  HENT:     Head: Normocephalic and atraumatic.     Right Ear: Tympanic membrane, ear canal and external ear normal. No drainage or tenderness. No middle ear effusion. There is no impacted cerumen. Tympanic membrane is not erythematous or bulging.     Left Ear: Tympanic membrane, ear canal and external ear normal. No drainage or tenderness.  No middle ear effusion. There is no impacted cerumen. Tympanic membrane is not erythematous or bulging.     Nose: Nose normal. No congestion or rhinorrhea.     Mouth/Throat:     Mouth: Mucous membranes are moist. No oral lesions.     Pharynx: No pharyngeal swelling, oropharyngeal exudate, posterior oropharyngeal erythema or uvula swelling.     Tonsils: No tonsillar exudate or tonsillar abscesses.  Eyes:     General: No scleral icterus.       Right eye: No discharge.        Left eye: No discharge.     Extraocular Movements: Extraocular movements intact.     Right eye: Normal extraocular motion.     Left eye: Normal extraocular motion.  Conjunctiva/sclera: Conjunctivae normal.  Cardiovascular:     Rate and Rhythm: Normal rate and regular rhythm.     Heart sounds: Normal heart sounds. No murmur heard.    No friction rub. No gallop.  Pulmonary:     Effort: Pulmonary effort is normal. No respiratory distress.     Breath sounds: No stridor. No wheezing, rhonchi or rales.  Chest:     Chest wall: No tenderness.  Musculoskeletal:     Cervical back: Normal range of motion and neck supple.  Lymphadenopathy:     Cervical: No cervical adenopathy.  Skin:    General: Skin is warm and dry.  Neurological:     General: No focal deficit present.     Mental Status: She is alert and oriented to person,  place, and time.  Psychiatric:        Mood and Affect: Mood normal.        Behavior: Behavior normal.     Patient given p.o. Tylenol and ibuprofen for high fever.  Assessment and Plan :   PDMP not reviewed this encounter.  1. Acute viral syndrome   2. Fever, unspecified     Deferred imaging given clear cardiopulmonary exam, hemodynamically stable vital signs. Will manage for viral illness such as viral URI, viral syndrome, viral rhinitis, COVID-19. Recommended supportive care. Offered scripts for symptomatic relief. Testing is pending. Counseled patient on potential for adverse effects with medications prescribed/recommended today, ER and return-to-clinic precautions discussed, patient verbalized understanding.   Patient should undergo Paxlovid treatment should she test positive for COVID-19 given her history of sepsis.  No history of CKD.   Wallis Bamberg, [place] 04/22/23 1949

## 2023-04-22 NOTE — Discharge Instructions (Addendum)
We will notify you of your test results as they arrive and may take between about 24 hours.  I encourage you to sign up for MyChart if you have not already done so as this can be the easiest way for [place] to communicate results to you online or through a phone app.  Generally, we only contact you if it is a positive test result.  In the meantime, if you develop worsening symptoms including fever, chest pain, shortness of breath despite our current treatment plan then please report to the emergency room as this may be a sign of worsening status from possible viral infection.  Otherwise, we will manage this as a viral syndrome. For sore throat or cough try using a honey-based tea. Use 3 teaspoons of honey with juice squeezed from half lemon. Place shaved pieces of ginger into 1/2-1 cup of water and warm over stove top. Then mix the ingredients and repeat every 4 hours as needed. Please take Tylenol 650mg  every 6 hours for aches and pains, fevers.  You can take this with or without ibuprofen especially if your fevers remain high.  The dosing would be 600mg  - 800mg  every 8 hours as needed.  Hydrate very well with at least 2 liters of water. Eat light meals such as soups to replenish electrolytes and soft fruits, veggies. Start an antihistamine like Zyrtec (10mg  daily) for postnasal drainage, sinus congestion.  You can take this together with pseudoephedrine (Sudafed) at a dose of 30 mg 2-3 times a day as needed for the same kind of congestion.  Use the cough medications as needed.

## 2023-04-23 ENCOUNTER — Telehealth: Payer: Self-pay

## 2023-04-23 LAB — SARS CORONAVIRUS 2 (TAT 6-24 HRS): SARS Coronavirus 2: POSITIVE — AB

## 2023-04-23 MED ORDER — PAXLOVID (300/100) 20 X 150 MG & 10 X 100MG PO TBPK: 3.0000 | ORAL_TABLET | Freq: Two times a day (BID) | ORAL | 0 refills | Status: AC

## 2023-04-23 NOTE — Telephone Encounter (Signed)
Per provider's note, "Patient should undergo Paxlovid treatment should she test positive for COVID-19 given her history of sepsis.  No history of CKD. "  Reviewed with patient, verified pharmacy, prescription sent Pt also requested extended work note. Notified can give three days from date of visit. Will send in MyChart.

## 2023-06-26 ENCOUNTER — Ambulatory Visit (INDEPENDENT_AMBULATORY_CARE_PROVIDER_SITE_OTHER): Payer: Medicaid Other | Admitting: Physician Assistant

## 2023-06-26 ENCOUNTER — Encounter: Payer: Self-pay | Admitting: Physician Assistant

## 2023-06-26 ENCOUNTER — Other Ambulatory Visit (HOSPITAL_COMMUNITY)
Admission: RE | Admit: 2023-06-26 | Discharge: 2023-06-26 | Disposition: A | Discharge status: Home / Self Care | Payer: Medicaid Other | Source: Ambulatory Visit | Attending: Physician Assistant | Admitting: Physician Assistant

## 2023-06-26 VITALS — BP 123/86 | HR 74 | Temp 97.5°F | Ht 67.0 in | Wt 225.4 lb

## 2023-06-26 DIAGNOSIS — Z0000 Encounter for general adult medical examination without abnormal findings: Secondary | ICD-10-CM | POA: Diagnosis not present

## 2023-06-26 DIAGNOSIS — R0683 Snoring: Secondary | ICD-10-CM | POA: Diagnosis not present

## 2023-06-26 DIAGNOSIS — Z01419 Encounter for gynecological examination (general) (routine) without abnormal findings: Secondary | ICD-10-CM | POA: Insufficient documentation

## 2023-06-26 DIAGNOSIS — Z3040 Encounter for surveillance of contraceptives, unspecified: Secondary | ICD-10-CM

## 2023-06-26 DIAGNOSIS — Z131 Encounter for screening for diabetes mellitus: Secondary | ICD-10-CM | POA: Diagnosis not present

## 2023-06-26 DIAGNOSIS — Z114 Encounter for screening for human immunodeficiency virus [HIV]: Secondary | ICD-10-CM

## 2023-06-26 DIAGNOSIS — Z13220 Encounter for screening for lipoid disorders: Secondary | ICD-10-CM

## 2023-06-26 DIAGNOSIS — Z23 Encounter for immunization: Secondary | ICD-10-CM

## 2023-06-26 DIAGNOSIS — Z1159 Encounter for screening for other viral diseases: Secondary | ICD-10-CM

## 2023-06-26 LAB — COMPREHENSIVE METABOLIC PANEL
ALT: 35 U/L (ref 0–35)
AST: 28 U/L (ref 0–37)
Albumin: 4.1 g/dL (ref 3.5–5.2)
Alkaline Phosphatase: 62 U/L (ref 39–117)
BUN: 9 mg/dL (ref 6–23)
CO2: 26 meq/L (ref 19–32)
Calcium: 9.2 mg/dL (ref 8.4–10.5)
Chloride: 106 meq/L (ref 96–112)
Creatinine, Ser: 0.78 mg/dL (ref 0.40–1.20)
GFR: 94.84 mL/min (ref >60.00)
Glucose, Bld: 83 mg/dL (ref 70–99)
Potassium: 3.7 meq/L (ref 3.5–5.1)
Sodium: 139 meq/L (ref 135–145)
Total Bilirubin: 0.4 mg/dL (ref 0.2–1.2)
Total Protein: 6.7 g/dL (ref 6.0–8.3)

## 2023-06-26 LAB — CBC WITH DIFFERENTIAL/PLATELET
Basophils Absolute: 0 10*3/uL (ref 0.0–0.1)
Basophils Relative: 0.5 % (ref 0.0–3.0)
Eosinophils Absolute: 0.3 10*3/uL (ref 0.0–0.7)
Eosinophils Relative: 3 % (ref 0.0–5.0)
HCT: 36.2 % (ref 36.0–46.0)
Hemoglobin: 12.3 g/dL (ref 12.0–15.0)
Lymphocytes Relative: 33.8 % (ref 12.0–46.0)
Lymphs Abs: 3.2 10*3/uL (ref 0.7–4.0)
MCHC: 34 g/dL (ref 30.0–36.0)
MCV: 89.8 fL (ref 78.0–100.0)
Monocytes Absolute: 0.5 10*3/uL (ref 0.1–1.0)
Monocytes Relative: 5.8 % (ref 3.0–12.0)
Neutro Abs: 5.3 10*3/uL (ref 1.4–7.7)
Neutrophils Relative %: 56.9 % (ref 43.0–77.0)
Platelets: 399 10*3/uL (ref 150.0–400.0)
RBC: 4.03 Mil/uL (ref 3.87–5.11)
RDW: 14.2 % (ref 11.5–15.5)
WBC: 9.4 10*3/uL (ref 4.0–10.5)

## 2023-06-26 LAB — LIPID PANEL
Cholesterol: 176 mg/dL (ref 0–200)
HDL: 46.5 mg/dL (ref >39.00)
LDL Cholesterol: 110 mg/dL — ABNORMAL HIGH (ref 0–99)
NonHDL: 129.37
Total CHOL/HDL Ratio: 4
Triglycerides: 99 mg/dL (ref 0.0–149.0)
VLDL: 19.8 mg/dL (ref 0.0–40.0)

## 2023-06-26 LAB — HEMOGLOBIN A1C: Hgb A1c MFr Bld: 5.6 % (ref 4.6–6.5)

## 2023-06-26 LAB — TSH: TSH: 0.71 u[IU]/mL (ref 0.35–5.50)

## 2023-06-26 MED ORDER — NORTREL 0.5/35 (28) 0.5-35 MG-MCG PO TABS
1.0000 | ORAL_TABLET | Freq: Every day | ORAL | 5 refills | Status: DC
Start: 2023-06-26 — End: 2024-06-15

## 2023-06-26 NOTE — Progress Notes (Signed)
Patient ID: Lori Cross, female    DOB: Apr 16, 1981, 42 y.o.   MRN: [id number]   Assessment & Plan:  Well woman exam with routine gynecological exam -     Lipid panel -     Comprehensive metabolic panel -     CBC with Differential/Platelet -     Hemoglobin A1c -     TSH -     Cytology - PAP -     RPR  Need for influenza vaccination -     Flu vaccine trivalent PF, 6mos and older(Flulaval,Afluria,Fluarix,Fluzone)  Encounter for surveillance of contraceptives, unspecified contraceptive -     Nortrel 0.5/35 (28); Take 1 tablet by mouth daily.  Dispense: 28 tablet; Refill: 5  Loud snoring -     Ambulatory referral to Sleep Studies  Encounter for HIV (human immunodeficiency virus) test -     HIV Antibody (routine testing w rflx)  Need for hepatitis C screening test -     Hepatitis C antibody   Assessment and Plan    Sleep Disturbance Reports poor sleep quality and snoring. No reported apnea or gasping for air. -Order sleep study to evaluate for possible sleep apnea.   Preventive Health/Screening -Order routine labs including glucose, cholesterol, and thyroid function tests. -Perform Pap smear today, last one was in 2019 with no history of abnormal results. -Order mammogram, patient recently turned 42. -Perform STD panel as patient is sexually active with both genders but not currently active.  Menstrual Cycle Regular menstrual cycle lasting 3-5 days. No complaints of dysmenorrhea or abnormal bleeding. -Continue current birth control regimen.  Skin Tag Noted on external genitalia, does not appear to be a wart. -No intervention needed at this time.        Return in about 1 year (around 06/25/2024) for physical.    Subjective:    Chief Complaint  Patient presents with   Annual Exam    Pt here for annual exam - pt non fasting    Snoring    Pt states that she is concerned of her loud snoring, still wakes up tired     HPI Discussed the use of AI scribe  software for clinical note transcription with the patient, who gave verbal consent to proceed.  History of Present Illness   The patient, presents with concerns about her sleep quality. She reports feeling unrested despite sleeping and has been told she snores loudly. The snoring is most pronounced during the initial stages of sleep and is disruptive enough to wake her. She denies any difficulty swallowing or any neck masses. She also reports a habit of avoiding her throat when coughing or sneezing, which she finds uncomfortable.  The patient's menstrual cycle is regular, lasting three to five days, and she is currently on birth control. She reports no abnormalities on previous Pap smears and is sexually active with both men and women. She denies any significant alcohol consumption.   Minimal exercise. Needs labs and pap smear updated today.       Past Medical History:  Diagnosis Date   Medical history non-contributory     Past Surgical History:  Procedure Laterality Date   NO PAST SURGERIES      Family History  Problem Relation Age of Onset   Diabetes Mother    Hypertension Mother    Diabetes Father    Hypertension Father    Thyroid disease Sister    Hypertension Maternal Grandfather    Hypertension Maternal Uncle  Social History   Tobacco Use   Smoking status: Never   Smokeless tobacco: Never  Vaping Use   Vaping status: Never Used  Substance Use Topics   Alcohol use: Yes    Comment: occ   Drug use: No     No Known Allergies  Review of Systems NEGATIVE UNLESS OTHERWISE INDICATED IN HPI      Objective:     BP 123/86   Pulse 74   Temp (!) 97.5 F (36.4 C)   Ht 5\' 7"  (1.702 m)   Wt 225 lb 6.4 oz (102.2 kg)   SpO2 99%   BMI 35.30 kg/m   Wt Readings from Last 3 Encounters:  06/26/23 225 lb 6.4 oz (102.2 kg)  10/24/22 229 lb 2 oz (103.9 kg)  07/18/22 220 lb 6.4 oz (100 kg)    BP Readings from Last 3 Encounters:  06/26/23 123/86  04/22/23 (!)  142/94  03/29/23 112/73     Physical Exam Vitals and nursing note reviewed. Exam conducted with a chaperone present.  Constitutional:      Appearance: Normal appearance. She is obese. She is not toxic-appearing.  HENT:     Head: Normocephalic and atraumatic.     Right Ear: Tympanic membrane, ear canal and external ear normal.     Left Ear: Tympanic membrane, ear canal and external ear normal.     Nose: Nose normal.     Mouth/Throat:     Mouth: Mucous membranes are moist.  Eyes:     Extraocular Movements: Extraocular movements intact.     Conjunctiva/sclera: Conjunctivae normal.     Pupils: Pupils are equal, round, and reactive to light.  Neck:     Thyroid: No thyroid mass, thyromegaly or thyroid tenderness.  Cardiovascular:     Rate and Rhythm: Normal rate and regular rhythm.     Pulses: Normal pulses.     Heart sounds: Normal heart sounds.  Pulmonary:     Effort: Pulmonary effort is normal.     Breath sounds: Normal breath sounds.  Chest:     Chest wall: No mass.  Breasts:    Right: Normal. No swelling, bleeding, inverted nipple, mass, nipple discharge, skin change or tenderness.     Left: Normal. No swelling, bleeding, inverted nipple, mass, nipple discharge, skin change or tenderness.  Abdominal:     General: Abdomen is flat. Bowel sounds are normal.     Palpations: Abdomen is soft.  Genitourinary:    General: Normal vulva.     Labia:        Right: No rash, tenderness or lesion.        Left: No rash, tenderness or lesion.      Vagina: Normal.     Cervix: Normal.     Uterus: Normal.      Adnexa: Right adnexa normal and left adnexa normal.  Musculoskeletal:        General: Normal range of motion.     Cervical back: Normal range of motion and neck supple.     Right lower leg: No edema.     Left lower leg: No edema.  Lymphadenopathy:     Cervical: No cervical adenopathy.     Upper Body:     Right upper body: No supraclavicular, axillary or pectoral adenopathy.      Left upper body: No supraclavicular, axillary or pectoral adenopathy.  Skin:    General: Skin is warm and dry.     Findings: Lesion (skin tag inner R thigh)  present.  Neurological:     General: No focal deficit present.     Mental Status: She is alert and oriented to person, place, and time.  Psychiatric:        Mood and Affect: Mood normal.        Behavior: Behavior normal.        Thought Content: Thought content normal.        Judgment: Judgment normal.            Noelly Lasseigne M Natalyn Szymanowski, PA-C

## 2023-06-26 NOTE — Patient Instructions (Signed)
VISIT SUMMARY:  During today's visit, we discussed your concerns about sleep quality and snoring, as well as your hypertension and general health maintenance. We also reviewed your menstrual cycle and noted a skin tag on your external genitalia.  YOUR PLAN:  -SLEEP DISTURBANCE: You have been experiencing poor sleep quality and loud snoring. We will order a sleep study to evaluate for possible sleep apnea, which is a condition where breathing repeatedly stops and starts during sleep.  -HYPERTENSION: Your blood pressure was elevated today, which could be related to your sleep issues and lifestyle factors. We will check your blood pressure again after you are dressed and consider lifestyle changes to help manage it.  -PREVENTIVE HEALTH/SCREENING: We will perform routine labs to check your glucose, cholesterol, and thyroid function. A Pap smear will be done today since your last one was in 2019, and we will also order a mammogram as you recently turned 42. Additionally, we will perform an STD panel as you are sexually active with both men and women.  -MENSTRUAL CYCLE: Your menstrual cycle is regular, lasting 3-5 days, with no issues reported. You should continue your current birth control regimen.  -SKIN TAG: A skin tag was noted on your external genitalia, but it does not appear to be a wart. No intervention is needed at this time.  INSTRUCTIONS:  Please follow up for the sleep study and routine labs as ordered. We will also need to recheck your blood pressure after you are dressed. Continue with your current birth control regimen and maintain your general health screenings as discussed.

## 2023-06-27 LAB — HEPATITIS C ANTIBODY: Hepatitis C Ab: NONREACTIVE

## 2023-06-27 LAB — RPR: RPR Ser Ql: NONREACTIVE

## 2023-06-27 LAB — HIV ANTIBODY (ROUTINE TESTING W REFLEX): HIV 1&2 Ab, 4th Generation: NONREACTIVE

## 2023-06-30 LAB — CYTOLOGY - PAP
Chlamydia: NEGATIVE
Comment: NEGATIVE
Comment: NEGATIVE
Comment: NEGATIVE
Comment: NORMAL
Diagnosis: NEGATIVE
High risk HPV: NEGATIVE
Neisseria Gonorrhea: NEGATIVE
Trichomonas: NEGATIVE

## 2023-08-05 ENCOUNTER — Ambulatory Visit
Admission: EM | Admit: 2023-08-05 | Discharge: 2023-08-05 | Disposition: A | Discharge status: Home / Self Care | Payer: Medicaid Other | Attending: Internal Medicine | Admitting: Internal Medicine

## 2023-08-05 DIAGNOSIS — N3001 Acute cystitis with hematuria: Secondary | ICD-10-CM

## 2023-08-05 LAB — POCT URINALYSIS DIP (MANUAL ENTRY)
Bilirubin, UA: NEGATIVE
Glucose, UA: NEGATIVE mg/dL
Ketones, POC UA: NEGATIVE mg/dL
Nitrite, UA: POSITIVE — AB
Protein Ur, POC: NEGATIVE mg/dL
Spec Grav, UA: 1.02 (ref 1.010–1.025)
Urobilinogen, UA: 0.2 U/dL
pH, UA: 6.5 (ref 5.0–8.0)

## 2023-08-05 LAB — POCT URINE PREGNANCY: Preg Test, Ur: NEGATIVE

## 2023-08-05 MED ORDER — NITROFURANTOIN MONOHYD MACRO 100 MG PO CAPS
100.0000 mg | ORAL_CAPSULE | Freq: Two times a day (BID) | ORAL | 0 refills | Status: DC
Start: 2023-08-05 — End: 2023-08-10

## 2023-08-05 NOTE — ED Provider Notes (Signed)
[facility]    CSN: [id number] Arrival date & time: 08/05/23  1735      History   Chief Complaint No chief complaint on file.   HPI Lori Cross is a 42 y.o. female presents for dysuria.  Patient reports 4 days of urinary urgency frequency and burning.  Denies any fevers, hematuria, nausea/vomiting, flank pain.  No vaginal discharge or STD concern.  Does have a history of reoccurring UTIs.  Has not taken any OTC medications for symptoms.  No other concerns at this time.  HPI  Past Medical History:  Diagnosis Date   Medical history non-contributory     Patient Active Problem List   Diagnosis Date Noted   Cystitis 05/29/2021   Obesity (BMI 30-39.9) 08/23/2014   Pyelonephritis 08/22/2014   Sepsis (HCC) 08/22/2014   Lower urinary tract infectious disease 08/22/2014    Past Surgical History:  Procedure Laterality Date   NO PAST SURGERIES      OB History   No obstetric history on file.      Home Medications    Prior to Admission medications   Medication Sig Start Date End Date Taking? Authorizing Provider  nitrofurantoin, macrocrystal-monohydrate, (MACROBID) 100 MG capsule Take 1 capsule (100 mg total) by mouth 2 (two) times daily. 08/05/23  Yes Radford Pax, NP  norethindrone-ethinyl estradiol (NORTREL 0.5/35, 28,) 0.5-35 MG-MCG tablet Take 1 tablet by mouth daily. 06/26/23   Allwardt, Crist Infante, PA-C  etonogestrel (IMPLANON) 68 MG IMPL implant Inject 1 each into the skin once.  10/17/19  [provider]    Family History Family History  Problem Relation Age of Onset   Diabetes Mother    Hypertension Mother    Diabetes Father    Hypertension Father    Thyroid disease Sister    Hypertension Maternal Grandfather    Hypertension Maternal Uncle     Social History Social History   Tobacco Use   Smoking status: Never   Smokeless tobacco: Never  Vaping Use   Vaping status: Never Used  Substance Use Topics   Alcohol use: Yes     Comment: occ   Drug use: No     Allergies   Patient has no known allergies.   Review of Systems Review of Systems  Genitourinary:  Positive for dysuria.     Physical Exam Triage Vital Signs ED Triage Vitals  Encounter Vitals Group     BP 08/05/23 1744 (!) 147/91     Systolic BP Percentile --      Diastolic BP Percentile --      Pulse Rate 08/05/23 1744 88     Resp 08/05/23 1744 16     Temp 08/05/23 1744 99 F (37.2 C)     Temp Source 08/05/23 1744 Oral     SpO2 08/05/23 1744 96 %     Weight --      Height --      Head Circumference --      Peak Flow --      Pain Score 08/05/23 1742 0     Pain Loc --      Pain Education --      Exclude from Growth Chart --    No data found.  Updated Vital Signs BP (!) 147/91 (BP Location: Left Arm)   Pulse 88   Temp 99 F (37.2 C) (Oral)   Resp 16   LMP 07/17/2023 (Exact Date)   SpO2 96%   Visual Acuity Right Eye Distance:  Left Eye Distance:   Bilateral Distance:    Right Eye Near:   Left Eye Near:    Bilateral Near:     Physical Exam Vitals and nursing note reviewed.  Constitutional:      Appearance: Normal appearance.  HENT:     Head: Normocephalic and atraumatic.  Eyes:     Pupils: Pupils are equal, round, and reactive to light.  Cardiovascular:     Rate and Rhythm: Normal rate.  Pulmonary:     Effort: Pulmonary effort is normal.  Abdominal:     Tenderness: There is no right CVA tenderness or left CVA tenderness.  Skin:    General: Skin is warm and dry.  Neurological:     General: No focal deficit present.     Mental Status: She is alert and oriented to person, place, and time.  Psychiatric:        Mood and Affect: Mood normal.        Behavior: Behavior normal.      UC Treatments / Results  Labs (all labs ordered are listed, but only abnormal results are displayed) Labs Reviewed  POCT URINALYSIS DIP (MANUAL ENTRY) - Abnormal; Notable for the following components:      Result Value   Clarity,  UA cloudy (*)    Blood, UA moderate (*)    Nitrite, UA Positive (*)    Leukocytes, UA Small (1+) (*)    All other components within normal limits  URINE CULTURE  POCT URINE PREGNANCY    EKG   Radiology No results found.  Procedures Procedures (including critical care time)  Medications Ordered in UC Medications - No data to display  Initial Impression / Assessment and Plan / UC Course  I have reviewed the triage vital signs and the nursing notes.  Pertinent labs & imaging results that were available during my care of the patient were reviewed by me and considered in my medical decision making (see chart for details).     Reviewed exam and symptoms with patient.  No red flags.  UA positive for UTI, will culture.  Reviewed urine cultures from July and August of this year.  Will start Macrobid twice daily for 7 days.  PCP follow-up as symptoms do not improve.  ER precautions reviewed. Final Clinical Impressions(s) / UC Diagnoses   Final diagnoses:  Acute cystitis with hematuria     Discharge Instructions      Start Macrobid twice daily for 7 days.  Lots of rest and fluids.  The clinic will contact you with results of the urine culture done today if positive.  Please follow-up with your PCP if your symptoms do not improve.  Please go to the ER if you develop any worsening symptoms.  Hope you feel better soon!    ED Prescriptions     Medication Sig Dispense Auth. Provider   nitrofurantoin, macrocrystal-monohydrate, (MACROBID) 100 MG capsule Take 1 capsule (100 mg total) by mouth 2 (two) times daily. 10 capsule Radford Pax, NP      PDMP not reviewed this encounter.   Radford Pax, NP 08/05/23 [phone]

## 2023-08-05 NOTE — Discharge Instructions (Signed)
Start Macrobid twice daily for 7 days.  Lots of rest and fluids.  The clinic will contact you with results of the urine culture done today if positive.  Please follow-up with your PCP if your symptoms do not improve.  Please go to the ER if you develop any worsening symptoms.  Hope you feel better soon!

## 2023-08-05 NOTE — ED Triage Notes (Signed)
Pt presents with c/o urinary frequency, dysuria and foul odor in urine X 4 days.

## 2023-08-07 LAB — URINE CULTURE: Culture: >=100000 — AB

## 2023-08-10 ENCOUNTER — Encounter: Payer: Self-pay | Admitting: Neurology

## 2023-08-10 ENCOUNTER — Ambulatory Visit: Payer: Medicaid Other | Admitting: Neurology

## 2023-08-10 VITALS — BP 129/77 | HR 92 | Ht 67.0 in | Wt 219.0 lb

## 2023-08-10 DIAGNOSIS — G4719 Other hypersomnia: Secondary | ICD-10-CM | POA: Diagnosis not present

## 2023-08-10 DIAGNOSIS — R0681 Apnea, not elsewhere classified: Secondary | ICD-10-CM | POA: Diagnosis not present

## 2023-08-10 DIAGNOSIS — E66811 Obesity, class 1: Secondary | ICD-10-CM

## 2023-08-10 DIAGNOSIS — R0683 Snoring: Secondary | ICD-10-CM

## 2023-08-10 DIAGNOSIS — Z9189 Other specified personal risk factors, not elsewhere classified: Secondary | ICD-10-CM

## 2023-08-10 NOTE — Progress Notes (Signed)
Subjective:    Patient ID: Lori Cross is a 42 y.o. female.  HPI    Huston Foley, MD, PhD [facility] [address] [address] [place], [place] [address]  Dear Lori Cross,  I saw your patient, Lori Cross, upon your kind request in my sleep clinic today for initial consultation of her sleep disorder, in particular, concern for underlying obstructive sleep apnea.  The patient is unaccompanied today.  As you know, Lori Cross is a 42 year old female with an underlying medical history of recurrent cystitis, elevated blood-pressure reading without formal diagnosis of hypertension, and obesity, who reports snoring and excessive daytime somnolence as well as witnessed apneas per friends.  Her Epworth sleepiness score is 20/24, fatigue severity score is 33 out of 63.  She does not have a family history of sleep apnea.  She is single and lives with her 79 year old son and her sister.  She snores loudly.  Bedtime is generally between 8 and 9 and rise time between 4 and 4:30 AM.  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  She drinks caffeine in the form of soda, 1 or 2 cans or 1 or 2 bottles per day on average.  She drinks alcohol occasionally or rarely.  She does not smoke cigarettes.  She smokes cannabis daily.  She denies nightly nocturia or recurrent nocturnal or morning headaches.  Weight has been fluctuating but lately has plateaued. I reviewed your office note from 06/26/2023.  She has a TV in her bedroom and puts it typically on a 30-minute timer.  They have no pets at the house.   Her Past Medical History Is Significant For: Past Medical History:  Diagnosis Date   Medical history non-contributory     Her Past Surgical History Is Significant For: Past Surgical History:  Procedure Laterality Date   DILATION AND CURETTAGE OF UTERUS     NO PAST SURGERIES     WISDOM TOOTH EXTRACTION      Her Family History Is Significant  For: Family History  Problem Relation Age of Onset   Diabetes Mother    Hypertension Mother    Diabetes Father    Hypertension Father    Thyroid disease Sister    Hypertension Maternal Uncle    Hypertension Maternal Grandfather    Sleep apnea Neg Hx    Migraines Neg Hx     Her Social History Is Significant For: Social History   Socioeconomic History   Marital status: Single    Spouse name: Not on file   Number of children: Not on file   Years of education: Not on file   Highest education level: Not on file  Occupational History   Occupation: [employer]  Tobacco Use   Smoking status: Never   Smokeless tobacco: Never  Vaping Use   Vaping status: Never Used  Substance and Sexual Activity   Alcohol use: Yes    Comment: occ   Drug use: Yes    Types: Marijuana   Sexual activity: Not Currently    Birth control/protection: Pill  Other Topics Concern   Not on file  Social History Narrative   Not on file   Social Drivers of Health   Financial Resource Strain: Not on file  Food Insecurity: Not on file  Transportation Needs: Not on file  Physical Activity: Not on file  Stress: Not on file  Social Connections: Not on file    Her Allergies Are:  No Known Allergies:   Her Current Medications Are:  Outpatient Encounter Medications as of 08/10/2023  Medication Sig   norethindrone-ethinyl estradiol (NORTREL 0.5/35, 28,) 0.5-35 MG-MCG tablet Take 1 tablet by mouth daily.   [DISCONTINUED] etonogestrel (IMPLANON) 68 MG IMPL implant Inject 1 each into the skin once.   [DISCONTINUED] nitrofurantoin, macrocrystal-monohydrate, (MACROBID) 100 MG capsule Take 1 capsule (100 mg total) by mouth 2 (two) times daily.   No facility-administered encounter medications on file as of 08/10/2023.  :   Review of Systems:  Out of a complete 14 point review of systems, all are reviewed and negative with the exception of these symptoms as listed below:  Review of Systems   Neurological:        Pt is here for sleep consult. No previous SS. Pt reports snoring, headaches and daytime fatigue. Pt denies HTN. ESS 20.    Objective:  Neurological Exam  Physical Exam Physical Examination:   Vitals:   08/10/23 1528  BP: 129/77  Pulse: 92    General Examination: The patient is a very pleasant 42 y.o. female in no acute distress. She appears well-developed and well-nourished and well groomed.   HEENT: Normocephalic, atraumatic, pupils are equal, round and reactive to light, extraocular tracking is good without limitation to gaze excursion or nystagmus noted. Hearing is grossly intact. Face is symmetric with normal facial animation. Speech is clear with no dysarthria noted. There is no hypophonia. There is no lip, neck/head, jaw or voice tremor. Neck is supple with full range of passive and active motion. There are no carotid bruits on auscultation. Oropharynx exam reveals: moderate mouth dryness, adequate dental hygiene and moderate airway crowding, due to small airway entry and redundant soft palate, tonsillar size of about 1+ bilaterally.  Mallampati class III.  Neck circumference 16 inches.  Slight crossbite and slight overbite noted.  Tongue protrudes centrally and palate elevates symmetrically.  Chest: Clear to auscultation without wheezing, rhonchi or crackles noted.  Heart: S1+S2+0, regular and normal without murmurs, rubs or gallops noted.   Abdomen: Soft, non-tender and non-distended.  Extremities: There is no pitting edema in the distal lower extremities bilaterally.   Skin: Warm and dry without trophic changes noted.   Musculoskeletal: exam reveals no obvious joint deformities.   Neurologically:  Mental status: The patient is awake, alert and oriented in all 4 spheres. Her immediate and remote memory, attention, language skills and fund of knowledge are appropriate. There is no evidence of aphasia, agnosia, apraxia or anomia. Speech is clear with  normal prosody and enunciation. Thought process is linear. Mood is normal and affect is normal.  Cranial nerves II - XII are as described above under HEENT exam.  Motor exam: Normal bulk, strength and tone is noted. There is no obvious action or resting tremor.  Fine motor skills and coordination: grossly intact.  Cerebellar testing: No dysmetria or intention tremor. There is no truncal or gait ataxia.  Sensory exam: intact to light touch in the upper and lower extremities.  Gait, station and balance: She stands easily. No veering to one side is noted. No leaning to one side is noted. Posture is age-appropriate and stance is narrow based. Gait shows normal stride length and normal pace. No problems turning are noted.   Assessment and Plan:  In summary, Lori Cross is a very pleasant 42 y.o.-year old female with an underlying medical history of recurrent cystitis, elevated blood-pressure reading without formal diagnosis of hypertension, and obesity, whose history and physical  exam are concerning for sleep disordered breathing, particularly obstructive sleep apnea (OSA). A laboratory attended sleep study is typically considered "gold standard" for evaluation of sleep disordered breathing.   I had a long chat with the patient about my findings and the diagnosis of sleep apnea, particularly OSA, its prognosis and treatment options. We talked about medical/conservative treatments, surgical interventions and non-pharmacological approaches for symptom control. I explained, in particular, the risks and ramifications of untreated moderate to severe OSA, especially with respect to developing cardiovascular disease down the road, including congestive heart failure (CHF), difficult to treat hypertension, cardiac arrhythmias (particularly A-fib), neurovascular complications including TIA, stroke and dementia. Even type 2 diabetes has, in part, been linked to untreated OSA. Symptoms of untreated OSA may include (but  may not be limited to) daytime sleepiness, nocturia (i.e. frequent nighttime urination), memory problems, mood irritability and suboptimally controlled or worsening mood disorder such as depression and/or anxiety, lack of energy, lack of motivation, physical discomfort, as well as recurrent headaches, especially morning or nocturnal headaches. We talked about the importance of maintaining a healthy lifestyle and striving for healthy weight.  The importance of complete THC smoking cessation was also addressed.  In addition, we talked about the importance of striving for and maintaining good sleep hygiene. I recommended a sleep study at this time. I outlined the differences between a laboratory attended sleep study which is considered more comprehensive and accurate over the option of a home sleep test (HST); the latter may lead to underestimation of sleep disordered breathing in some instances and does not help with diagnosing upper airway resistance syndrome and is not accurate enough to diagnose primary central sleep apnea typically. I outlined possible surgical and non-surgical treatment options of OSA, including the use of a positive airway pressure (PAP) device (i.e. CPAP, AutoPAP/APAP or BiPAP in certain circumstances), a custom-made dental device (aka oral appliance, which would require a referral to a specialist dentist or orthodontist typically, and is generally speaking not considered for patients with full dentures or edentulous state), upper airway surgical options, such as traditional UPPP (which is not considered a first-line treatment) or the Inspire device (hypoglossal nerve stimulator, which would involve a referral for consultation with an ENT surgeon, after careful selection, following inclusion criteria - also not first-line treatment). I explained the PAP treatment option to the patient in detail, as this is generally considered first-line treatment.  The patient indicated that she would be  willing to try PAP therapy, if the need arises. I explained the importance of being compliant with PAP treatment, not only for insurance purposes but primarily to improve patient's symptoms symptoms, and for the patient's long term health benefit, including to reduce Her cardiovascular risks longer-term.    We will pick up our discussion about the next steps and treatment options after testing.  We will keep her posted as to the test results by phone call and/or MyChart messaging where possible.  We will plan to follow-up in sleep clinic accordingly as well.  I answered all her questions today and the patient was in agreement.   I encouraged her to call with any interim questions, concerns, problems or updates or email [place] through MyChart.  Generally speaking, sleep test authorizations may take up to 2 weeks, sometimes less, sometimes longer, the patient is encouraged to get in touch with [place] if they do not hear back from the sleep lab staff directly within the next 2 weeks.  Thank you very much for allowing me to  participate in the care of this nice patient. If I can be of any further assistance to you please do not hesitate to call me at [phone].  Sincerely,   Huston Foley, MD, PhD

## 2023-08-10 NOTE — Patient Instructions (Signed)

## 2023-08-25 ENCOUNTER — Ambulatory Visit
Admission: EM | Admit: 2023-08-25 | Discharge: 2023-08-25 | Disposition: A | Discharge status: Home / Self Care | Payer: Medicaid Other | Attending: Family Medicine | Admitting: Family Medicine

## 2023-08-25 DIAGNOSIS — N898 Other specified noninflammatory disorders of vagina: Secondary | ICD-10-CM | POA: Diagnosis present

## 2023-08-25 DIAGNOSIS — N760 Acute vaginitis: Secondary | ICD-10-CM | POA: Insufficient documentation

## 2023-08-25 DIAGNOSIS — Z113 Encounter for screening for infections with a predominantly sexual mode of transmission: Secondary | ICD-10-CM | POA: Diagnosis present

## 2023-08-25 NOTE — ED Triage Notes (Signed)
 Pt presents to UC for c/o vaginal irritation and itching x1 week. Reports brown vaginal discharge. States she felt a bump around her vaginal opening yesterday.

## 2023-08-25 NOTE — ED Provider Notes (Signed)
 [facility]    CSN: [id number] Arrival date & time: 08/25/23  1521      History   Chief Complaint No chief complaint on file.   HPI Lori Cross is a 43 y.o. female presents for vaginal discharge.  Patient reports 1 week of a brown nonpruritic non-malodorous vaginal discharge.  She thought it was associated with the beginning of her period but states it did not stop afterwards.  Denies any dysuria, fevers, nausea/vomiting, flank pain.  Did recently have unprotected intercourse and would like STD screening although no known exposure.  In addition she noticed a bump on her vaginal opening yesterday that was itchy and painful.  Denies history of HSV.  States she does wax the area often.  No OTC medications have been used for the symptoms.  No other concerns at this time.  HPI  Past Medical History:  Diagnosis Date   Medical history non-contributory     Patient Active Problem List   Diagnosis Date Noted   Cystitis 05/29/2021   Obesity (BMI 30-39.9) 08/23/2014   Pyelonephritis 08/22/2014   Sepsis (HCC) 08/22/2014   Lower urinary tract infectious disease 08/22/2014    Past Surgical History:  Procedure Laterality Date   DILATION AND CURETTAGE OF UTERUS     NO PAST SURGERIES     WISDOM TOOTH EXTRACTION      OB History   No obstetric history on file.      Home Medications    Prior to Admission medications   Medication Sig Start Date End Date Taking? Authorizing Provider  norethindrone-ethinyl estradiol (NORTREL  0.5/35, 28,) 0.5-35 MG-MCG tablet Take 1 tablet by mouth daily. 06/26/23   Allwardt, Mardy HERO, PA-C  etonogestrel (IMPLANON) 68 MG IMPL implant Inject 1 each into the skin once.  10/17/19  [provider]    Family History Family History  Problem Relation Age of Onset   Diabetes Mother    Hypertension Mother    Diabetes Father    Hypertension Father    Thyroid disease Sister    Hypertension Maternal Uncle    Hypertension Maternal  Grandfather    Sleep apnea Neg Hx    Migraines Neg Hx     Social History Social History   Tobacco Use   Smoking status: Never   Smokeless tobacco: Never  Vaping Use   Vaping status: Never Used  Substance Use Topics   Alcohol use: Yes    Comment: occ   Drug use: Yes    Types: Marijuana     Allergies   Patient has no known allergies.   Review of Systems Review of Systems  Genitourinary:  Positive for genital sores and vaginal discharge.     Physical Exam Triage Vital Signs ED Triage Vitals  Encounter Vitals Group     BP 08/25/23 1534 (!) 145/82     Systolic BP Percentile --      Diastolic BP Percentile --      Pulse Rate 08/25/23 1534 72     Resp 08/25/23 1534 16     Temp 08/25/23 1534 98.5 F (36.9 C)     Temp Source 08/25/23 1534 Oral     SpO2 08/25/23 1534 98 %     Weight --      Height --      Head Circumference --      Peak Flow --      Pain Score 08/25/23 1532 0     Pain Loc --  Pain Education --      Exclude from Growth Chart --    No data found.  Updated Vital Signs BP (!) 145/82 (BP Location: Right Arm)   Pulse 72   Temp 98.5 F (36.9 C) (Oral)   Resp 16   LMP 08/16/2023 (Exact Date)   SpO2 98%   Visual Acuity Right Eye Distance:   Left Eye Distance:   Bilateral Distance:    Right Eye Near:   Left Eye Near:    Bilateral Near:     Physical Exam Vitals and nursing note reviewed. Exam conducted with a chaperone present Scientist, Research (medical)).  Constitutional:      Appearance: Normal appearance.  HENT:     Head: Normocephalic and atraumatic.  Eyes:     Pupils: Pupils are equal, round, and reactive to light.  Cardiovascular:     Rate and Rhythm: Normal rate.  Pulmonary:     Effort: Pulmonary effort is normal.  Abdominal:     Tenderness: There is no right CVA tenderness or left CVA tenderness.  Genitourinary:    Pubic Area: No rash.      Labia:        Right: Tenderness present. No rash, lesion or injury.        Comments: There  is a small papule to the right lower labia that is mildly tender to palpation.  No erythema swelling or warmth.  No vesicle. Skin:    General: Skin is warm and dry.  Neurological:     General: No focal deficit present.     Mental Status: She is alert and oriented to person, place, and time.  Psychiatric:        Mood and Affect: Mood normal.        Behavior: Behavior normal.      UC Treatments / Results  Labs (all labs ordered are listed, but only abnormal results are displayed) Labs Reviewed  HSV CULTURE AND TYPING  RPR  HIV ANTIBODY (ROUTINE TESTING W REFLEX)  CERVICOVAGINAL ANCILLARY ONLY    EKG   Radiology No results found.  Procedures Procedures (including critical care time)  Medications Ordered in UC Medications - No data to display  Initial Impression / Assessment and Plan / UC Course  I have reviewed the triage vital signs and the nursing notes.  Pertinent labs & imaging results that were available during my care of the patient were reviewed by me and considered in my medical decision making (see chart for details).     Reviewed exam and symptoms with patient.  No red flags.  STD testing as ordered we will contact for any positive results.  Will await results prior to initiating any treatment and patient is in agreement.  Vaginal sore more looks more consistent with ingrown hair that is not infected.  HSV swab taken per patient request.  Advised to avoid waxing/shaving until symptoms resolve.  Warm compresses as needed.  PCP or GYN follow-up if symptoms do not improve.  ER precautions reviewed Final Clinical Impressions(s) / UC Diagnoses   Final diagnoses:  Acute vaginitis  Vaginal sore  Screening examination for STD (sexually transmitted disease)     Discharge Instructions      The clinic will contact you with any positive results of the STD testing done today.  We do not call negative results.  Avoid shaving or waxing the vaginal area and do warm  compresses to the area as needed.  Please follow-up with your PCP or GYN if  symptoms do not improve.  Please go to the ER for any worsening symptoms.  Hope you feel better soon!     ED Prescriptions   None    PDMP not reviewed this encounter.   Loreda Myla SAUNDERS, NP 08/25/23 [phone]

## 2023-08-25 NOTE — Discharge Instructions (Addendum)
 The clinic will contact you with any positive results of the STD testing done today.  We do not call negative results.  Avoid shaving or waxing the vaginal area and do warm compresses to the area as needed.  Please follow-up with your PCP or GYN if symptoms do not improve.  Please go to the ER for any worsening symptoms.  Hope you feel better soon!

## 2023-08-26 ENCOUNTER — Telehealth (HOSPITAL_COMMUNITY): Payer: Self-pay | Admitting: Emergency Medicine

## 2023-08-26 LAB — CERVICOVAGINAL ANCILLARY ONLY
Bacterial Vaginitis (gardnerella): NEGATIVE
Candida Glabrata: NEGATIVE
Candida Vaginitis: POSITIVE — AB
Chlamydia: NEGATIVE
Comment: NEGATIVE
Comment: NEGATIVE
Comment: NEGATIVE
Comment: NEGATIVE
Comment: NEGATIVE
Comment: NORMAL
Neisseria Gonorrhea: NEGATIVE
Trichomonas: NEGATIVE

## 2023-08-26 LAB — RPR: RPR Ser Ql: NONREACTIVE

## 2023-08-26 LAB — HIV ANTIBODY (ROUTINE TESTING W REFLEX): HIV Screen 4th Generation wRfx: NONREACTIVE

## 2023-08-26 MED ORDER — FLUCONAZOLE 150 MG PO TABS: 150.0000 mg | ORAL_TABLET | Freq: Once | ORAL | 0 refills | Status: AC

## 2023-08-26 NOTE — Telephone Encounter (Signed)
 Diflucan for positive yeast, per protocol

## 2023-08-28 LAB — HSV CULTURE AND TYPING

## 2023-08-31 ENCOUNTER — Telehealth: Payer: Self-pay | Admitting: Neurology

## 2023-08-31 NOTE — Telephone Encounter (Signed)
 NPSG [facility] pending.

## 2023-09-01 NOTE — Telephone Encounter (Signed)
HST- [facility] [employer] no auth req via fax form

## 2023-09-02 NOTE — Telephone Encounter (Signed)
 Checked stated for the NPSG it is still pending.

## 2023-09-03 NOTE — Telephone Encounter (Signed)
Checked status for NPSG it is still pending.

## 2023-09-07 NOTE — Telephone Encounter (Signed)
[facility] [employer] denied the NPSG  HST [facility] no auth req.  See below for the NPSG denial reasoning.

## 2023-10-01 ENCOUNTER — Ambulatory Visit
Admission: RE | Admit: 2023-10-01 | Discharge: 2023-10-01 | Disposition: A | Discharge status: Home / Self Care | Payer: Medicaid Other | Source: Ambulatory Visit | Attending: Family Medicine | Admitting: Family Medicine

## 2023-10-01 VITALS — BP 127/87 | HR 87 | Temp 98.3°F | Resp 16

## 2023-10-01 DIAGNOSIS — N760 Acute vaginitis: Secondary | ICD-10-CM | POA: Diagnosis present

## 2023-10-01 MED ORDER — FLUCONAZOLE 150 MG PO TABS: 150.0000 mg | ORAL_TABLET | Freq: Every day | ORAL | 0 refills | Status: AC

## 2023-10-01 NOTE — ED Triage Notes (Signed)
Pt presents with c/o vaginal and itching discharge x 1 wk. Pt states she was prescribed medicine previously and doesn't know if it hellped.

## 2023-10-01 NOTE — ED Provider Notes (Signed)
[facility]    CSN: [id number] Arrival date & time: 10/01/23  1450      History   Chief Complaint Chief Complaint  Patient presents with   Vaginal Discharge    HPI Lori Cross is a 43 y.o. female presents for vaginal itching.  Patient reports 1 week of vaginal itching with minimal discharge.  Denies any rashes, lumps or bumps, fevers, dysuria, flank pain, or chills.  No STD concern or exposure.  Patient was seen in urgent care in January for similar complaint and did test positive for yeast.  She was sent Diflucan but states she only got 1 pill and feels like it improved but did not completely resolve.  No OTC medications have been taken for symptoms.  No other concerns at this time.   Vaginal Discharge   Past Medical History:  Diagnosis Date   Medical history non-contributory     Patient Active Problem List   Diagnosis Date Noted   Cystitis 05/29/2021   Obesity (BMI 30-39.9) 08/23/2014   Pyelonephritis 08/22/2014   Sepsis (HCC) 08/22/2014   Lower urinary tract infectious disease 08/22/2014    Past Surgical History:  Procedure Laterality Date   DILATION AND CURETTAGE OF UTERUS     NO PAST SURGERIES     WISDOM TOOTH EXTRACTION      OB History   No obstetric history on file.      Home Medications    Prior to Admission medications   Medication Sig Start Date End Date Taking? Authorizing Provider  fluconazole (DIFLUCAN) 150 MG tablet Take 1 tablet (150 mg total) by mouth daily for 2 doses. Take 1 tablet today and repeat in 3 days if symptoms persist 10/01/23 10/03/23 Yes Radford Pax, NP  norethindrone-ethinyl estradiol (NORTREL 0.5/35, 28,) 0.5-35 MG-MCG tablet Take 1 tablet by mouth daily. 06/26/23   Allwardt, Crist Infante, PA-C  etonogestrel (IMPLANON) 68 MG IMPL implant Inject 1 each into the skin once.  10/17/19  [provider]    Family History Family History  Problem Relation Age of Onset   Diabetes Mother    Hypertension Mother     Diabetes Father    Hypertension Father    Thyroid disease Sister    Hypertension Maternal Uncle    Hypertension Maternal Grandfather    Sleep apnea Neg Hx    Migraines Neg Hx     Social History Social History   Tobacco Use   Smoking status: Never   Smokeless tobacco: Never  Vaping Use   Vaping status: Never Used  Substance Use Topics   Alcohol use: Yes    Comment: occ   Drug use: Yes    Types: Marijuana     Allergies   Patient has no known allergies.   Review of Systems Review of Systems  Genitourinary:  Positive for vaginal discharge.     Physical Exam Triage Vital Signs ED Triage Vitals  Encounter Vitals Group     BP 10/01/23 1513 127/87     Systolic BP Percentile --      Diastolic BP Percentile --      Pulse Rate 10/01/23 1513 87     Resp 10/01/23 1513 16     Temp 10/01/23 1513 98.3 F (36.8 C)     Temp Source 10/01/23 1513 Oral     SpO2 10/01/23 1513 99 %     Weight --      Height --      Head Circumference --  Peak Flow --      Pain Score 10/01/23 1512 0     Pain Loc --      Pain Education --      Exclude from Growth Chart --    No data found.  Updated Vital Signs BP 127/87 (BP Location: Right Arm)   Pulse 87   Temp 98.3 F (36.8 C) (Oral)   Resp 16   LMP 09/11/2023 (Exact Date)   SpO2 99%   Visual Acuity Right Eye Distance:   Left Eye Distance:   Bilateral Distance:    Right Eye Near:   Left Eye Near:    Bilateral Near:     Physical Exam Vitals and nursing note reviewed.  Constitutional:      Appearance: Normal appearance.  HENT:     Head: Normocephalic and atraumatic.  Eyes:     Pupils: Pupils are equal, round, and reactive to light.  Cardiovascular:     Rate and Rhythm: Normal rate.  Pulmonary:     Effort: Pulmonary effort is normal.  Abdominal:     Tenderness: There is no right CVA tenderness or left CVA tenderness.  Skin:    General: Skin is warm and dry.  Neurological:     General: No focal deficit present.      Mental Status: She is alert and oriented to person, place, and time.  Psychiatric:        Mood and Affect: Mood normal.        Behavior: Behavior normal.      UC Treatments / Results  Labs (all labs ordered are listed, but only abnormal results are displayed) Labs Reviewed  CERVICOVAGINAL ANCILLARY ONLY    EKG   Radiology No results found.  Procedures Procedures (including critical care time)  Medications Ordered in UC Medications - No data to display  Initial Impression / Assessment and Plan / UC Course  I have reviewed the triage vital signs and the nursing notes.  Pertinent labs & imaging results that were available during my care of the patient were reviewed by me and considered in my medical decision making (see chart for details).     Vaginal swab is ordered and will contact for any positive results.  She declines any additional STD testing today.  Will start Diflucan as prescribed.  Advised GYN or PCP follow-up if symptoms do not improve.  ER precautions reviewed and she verbalized understanding. Final Clinical Impressions(s) / UC Diagnoses   Final diagnoses:  Acute vaginitis     Discharge Instructions      The clinical contact you with results of the vaginal swab done today if positive.  Take Diflucan as prescribed.  Please follow-up with your PCP or gynecologist if your symptoms do not improve.  Please go to the ER for any worsening symptoms.  Hope you feel better soon!    ED Prescriptions     Medication Sig Dispense Auth. Provider   fluconazole (DIFLUCAN) 150 MG tablet Take 1 tablet (150 mg total) by mouth daily for 2 doses. Take 1 tablet today and repeat in 3 days if symptoms persist 2 tablet Radford Pax, NP      PDMP not reviewed this encounter.   Radford Pax, NP 10/01/23 1525

## 2023-10-01 NOTE — Discharge Instructions (Addendum)
The clinical contact you with results of the vaginal swab done today if positive.  Take Diflucan as prescribed.  Please follow-up with your PCP or gynecologist if your symptoms do not improve.  Please go to the ER for any worsening symptoms.  Hope you feel better soon!

## 2023-10-02 LAB — CERVICOVAGINAL ANCILLARY ONLY
Bacterial Vaginitis (gardnerella): NEGATIVE
Candida Glabrata: NEGATIVE
Candida Vaginitis: POSITIVE — AB
Comment: NEGATIVE
Comment: NEGATIVE
Comment: NEGATIVE

## 2023-12-11 ENCOUNTER — Telehealth

## 2023-12-11 DIAGNOSIS — N390 Urinary tract infection, site not specified: Secondary | ICD-10-CM

## 2023-12-11 MED ORDER — NITROFURANTOIN MONOHYD MACRO 100 MG PO CAPS: 100.0000 mg | ORAL_CAPSULE | Freq: Two times a day (BID) | ORAL | 0 refills | Status: AC

## 2023-12-11 NOTE — Patient Instructions (Signed)

## 2023-12-11 NOTE — Progress Notes (Signed)
 Virtual Visit Consent   Lori Cross, you are scheduled for a virtual visit with a [facility] provider today. Just as with appointments in the office, your consent must be obtained to participate. Your consent will be active for this visit and any virtual visit you may have with one of our providers in the next 365 days. If you have a MyChart account, a copy of this consent can be sent to you electronically.  As this is a virtual visit, video technology does not allow for your provider to perform a traditional examination. This may limit your provider's ability to fully assess your condition. If your provider identifies any concerns that need to be evaluated in person or the need to arrange testing (such as labs, EKG, etc.), we will make arrangements to do so. Although advances in technology are sophisticated, we cannot ensure that it will always work on either your end or our end. If the connection with a video visit is poor, the visit may have to be switched to a telephone visit. With either a video or telephone visit, we are not always able to ensure that we have a secure connection.  By engaging in this virtual visit, you consent to the provision of healthcare and authorize for your insurance to be billed (if applicable) for the services provided during this visit. Depending on your insurance coverage, you may receive a charge related to this service.  I need to obtain your verbal consent now. Are you willing to proceed with your visit today? Lori Cross has provided verbal consent on 12/11/2023 for a virtual visit (video or telephone). Lori Huger, FNP  Date: 12/11/2023 9:33 AM   Virtual Visit via Video Note   I, Lori Cross, connected with  Lori Cross  ([id number], 03-30-1981) on 12/11/23 at  9:30 AM EDT by a video-enabled telemedicine application and verified that I am speaking with the correct person using two identifiers.  Location: Patient: Virtual Visit Location Patient:  Home Provider: Virtual Visit Location Provider: Home Office   I discussed the limitations of evaluation and management by telemedicine and the availability of in person appointments. The patient expressed understanding and agreed to proceed.    History of Present Illness: Lori Cross is a 43 y.o. who identifies as a female who was assigned female at birth, and is being seen today for burning and frequency with urination for 2 days. No fever or abd pain. Last one several months ago--.  HPI: HPI  Problems:  Patient Active Problem List   Diagnosis Date Noted   Cystitis 05/29/2021   Obesity (BMI 30-39.9) 08/23/2014   Pyelonephritis 08/22/2014   Sepsis (HCC) 08/22/2014   Lower urinary tract infectious disease 08/22/2014    Allergies: No Known Allergies Medications:  Current Outpatient Medications:    norethindrone-ethinyl estradiol (NORTREL  0.5/35, 28,) 0.5-35 MG-MCG tablet, Take 1 tablet by mouth daily., Disp: 28 tablet, Rfl: 5  Observations/Objective: Patient is well-developed, well-nourished in no acute distress.  Resting comfortably  at home.  Head is normocephalic, atraumatic.  No labored breathing.  Speech is clear and coherent with logical content.  Patient is alert and oriented at baseline.    Assessment and Plan: 1. Urinary tract infection without hematuria, site unspecified (Primary)  Increase fluids, preventative measures discussed.  Follow Up Instructions: I discussed the assessment and treatment plan with the patient. The patient was provided an opportunity to ask questions and all were answered. The patient agreed with the plan and demonstrated an  understanding of the instructions.  A copy of instructions were sent to the patient via MyChart unless otherwise noted below.     The patient was advised to call back or seek an in-person evaluation if the symptoms worsen or if the condition fails to improve as anticipated.    Lori Hebb, FNP

## 2024-02-17 ENCOUNTER — Telehealth: Admitting: Physician Assistant

## 2024-02-17 DIAGNOSIS — R3989 Other symptoms and signs involving the genitourinary system: Secondary | ICD-10-CM | POA: Diagnosis not present

## 2024-02-17 MED ORDER — CEPHALEXIN 500 MG PO CAPS: 500.0000 mg | ORAL_CAPSULE | Freq: Two times a day (BID) | ORAL | 0 refills | Status: AC

## 2024-02-17 MED ORDER — FLUCONAZOLE 150 MG PO TABS: ORAL_TABLET | ORAL | 0 refills | Status: DC

## 2024-02-17 NOTE — Addendum Note (Signed)
 Addended by: GLADIS ELSIE BROCKS on: 02/17/2024 04:02 PM   Modules accepted: Orders

## 2024-02-17 NOTE — Progress Notes (Signed)
 I have spent 5 minutes in review of e-visit questionnaire, review and updating patient chart, medical decision making and response to patient.   Piedad Climes, PA-C

## 2024-02-17 NOTE — Progress Notes (Signed)

## 2024-04-27 ENCOUNTER — Telehealth: Admitting: Physician Assistant

## 2024-04-27 DIAGNOSIS — R3989 Other symptoms and signs involving the genitourinary system: Secondary | ICD-10-CM

## 2024-04-27 DIAGNOSIS — T3695XA Adverse effect of unspecified systemic antibiotic, initial encounter: Secondary | ICD-10-CM | POA: Diagnosis not present

## 2024-04-27 DIAGNOSIS — B379 Candidiasis, unspecified: Secondary | ICD-10-CM | POA: Diagnosis not present

## 2024-04-28 MED ORDER — FLUCONAZOLE 150 MG PO TABS: 150.0000 mg | ORAL_TABLET | ORAL | 0 refills | Status: DC | PRN

## 2024-04-28 MED ORDER — NITROFURANTOIN MONOHYD MACRO 100 MG PO CAPS: 100.0000 mg | ORAL_CAPSULE | Freq: Two times a day (BID) | ORAL | 0 refills | Status: DC

## 2024-04-28 NOTE — Progress Notes (Signed)

## 2024-04-28 NOTE — Addendum Note (Signed)
 Addended by: VIVIENNE DELON HERO on: 04/28/2024 05:33 PM   Modules accepted: Orders

## 2024-06-15 ENCOUNTER — Encounter: Payer: Self-pay | Admitting: Physician Assistant

## 2024-06-15 ENCOUNTER — Ambulatory Visit: Admitting: Physician Assistant

## 2024-06-15 VITALS — BP 112/66 | HR 88 | Temp 97.9°F | Ht 67.0 in | Wt 205.5 lb

## 2024-06-15 DIAGNOSIS — N920 Excessive and frequent menstruation with regular cycle: Secondary | ICD-10-CM

## 2024-06-15 DIAGNOSIS — Z131 Encounter for screening for diabetes mellitus: Secondary | ICD-10-CM

## 2024-06-15 DIAGNOSIS — Z3040 Encounter for surveillance of contraceptives, unspecified: Secondary | ICD-10-CM

## 2024-06-15 DIAGNOSIS — R252 Cramp and spasm: Secondary | ICD-10-CM

## 2024-06-15 DIAGNOSIS — Z23 Encounter for immunization: Secondary | ICD-10-CM

## 2024-06-15 DIAGNOSIS — Z1231 Encounter for screening mammogram for malignant neoplasm of breast: Secondary | ICD-10-CM

## 2024-06-15 LAB — CBC WITH DIFFERENTIAL/PLATELET
Basophils Absolute: 0.1 K/uL (ref 0.0–0.1)
Basophils Relative: 0.6 % (ref 0.0–3.0)
Eosinophils Absolute: 0.3 K/uL (ref 0.0–0.7)
Eosinophils Relative: 3.2 % (ref 0.0–5.0)
HCT: 41.1 % (ref 36.0–46.0)
Hemoglobin: 14 g/dL (ref 12.0–15.0)
Lymphocytes Relative: 26 % (ref 12.0–46.0)
Lymphs Abs: 2.6 K/uL (ref 0.7–4.0)
MCHC: 34 g/dL (ref 30.0–36.0)
MCV: 92.3 fl (ref 78.0–100.0)
Monocytes Absolute: 0.6 K/uL (ref 0.1–1.0)
Monocytes Relative: 5.7 % (ref 3.0–12.0)
Neutro Abs: 6.5 K/uL (ref 1.4–7.7)
Neutrophils Relative %: 64.5 % (ref 43.0–77.0)
Platelets: 408 K/uL — ABNORMAL HIGH (ref 150.0–400.0)
RBC: 4.45 Mil/uL (ref 3.87–5.11)
RDW: 13.8 % (ref 11.5–15.5)
WBC: 10 K/uL (ref 4.0–10.5)

## 2024-06-15 LAB — COMPREHENSIVE METABOLIC PANEL WITH GFR
ALT: 68 U/L — ABNORMAL HIGH (ref 0–35)
AST: 50 U/L — ABNORMAL HIGH (ref 0–37)
Albumin: 4.4 g/dL (ref 3.5–5.2)
Alkaline Phosphatase: 58 U/L (ref 39–117)
BUN: 14 mg/dL (ref 6–23)
CO2: 27 meq/L (ref 19–32)
Calcium: 9.8 mg/dL (ref 8.4–10.5)
Chloride: 104 meq/L (ref 96–112)
Creatinine, Ser: 0.85 mg/dL (ref 0.40–1.20)
GFR: 84.96 mL/min (ref >60.00)
Glucose, Bld: 82 mg/dL (ref 70–99)
Potassium: 4.5 meq/L (ref 3.5–5.1)
Sodium: 137 meq/L (ref 135–145)
Total Bilirubin: 0.5 mg/dL (ref 0.2–1.2)
Total Protein: 7 g/dL (ref 6.0–8.3)

## 2024-06-15 LAB — MAGNESIUM: Magnesium: 1.9 mg/dL (ref 1.5–2.5)

## 2024-06-15 LAB — IBC + FERRITIN
Ferritin: 33.9 ng/mL (ref 10.0–291.0)
Iron: 66 ug/dL (ref 42–145)
Saturation Ratios: 16.3 % — ABNORMAL LOW (ref 20.0–50.0)
TIBC: 406 ug/dL (ref 250.0–450.0)
Transferrin: 290 mg/dL (ref 212.0–360.0)

## 2024-06-15 LAB — TSH: TSH: 0.62 u[IU]/mL (ref 0.35–5.50)

## 2024-06-15 LAB — HEMOGLOBIN A1C: Hgb A1c MFr Bld: 5.5 % (ref 4.6–6.5)

## 2024-06-15 LAB — VITAMIN B12: Vitamin B-12: 383 pg/mL (ref 211–911)

## 2024-06-15 MED ORDER — NORTREL 0.5/35 (28) 0.5-35 MG-MCG PO TABS: 1.0000 | ORAL_TABLET | Freq: Every day | ORAL | 11 refills | Status: AC

## 2024-06-15 NOTE — Progress Notes (Signed)
 Patient ID: FREEDOM LOPEZPEREZ, female    DOB: April 29, 1981, 43 y.o.   MRN: [id number]   Assessment & Plan:  Leg cramps -     Magnesium -     Comprehensive metabolic panel with GFR -     Vitamin B12 -     CBC with Differential/Platelet -     IBC + Ferritin -     TSH  Menorrhagia with regular cycle  Need for immunization against influenza -     Flu vaccine trivalent PF, 6mos and older(Flulaval,Afluria,Fluarix,Fluzone)  Screening mammogram for breast cancer -     Digital Screening Mammogram, Left and Right; Future  Encounter for immunization  Encounter for surveillance of contraceptives, unspecified contraceptive -     Nortrel  0.5/35 (28); Take 1 tablet by mouth daily.  Dispense: 28 tablet; Refill: 11  Screening for diabetes mellitus -     Hemoglobin A1c    Assessment & Plan Cramp and spasm, left leg Intermittent cramping and spasms in the left leg, primarily nocturnal but also diurnal, causing significant pain and functional impairment. Possible contributing factors include dehydration, electrolyte imbalance, prolonged standing, and inadequate footwear. Differential diagnosis includes electrolyte imbalance, dehydration, and musculoskeletal strain. - Ordered lab tests for electrolytes, magnesium, ferritin, and B12 levels. - Advised increased fluid intake, including water and electrolyte solutions like [employer]. - Recommended frequent sit breaks and stretching exercises for hamstrings and calves. - Suggested use of a massage gun for muscle relaxation. - Recommended wearing compression socks to support veins and improve circulation. - Advised on proper footwear, such as Brooks shoes.  Excessive and frequent menstruation with regular cycle Menstrual periods are regular but associated with significant pain and large clots, leading to fatigue. Possible iron deficiency due to heavy menstrual bleeding. - Ordered lab tests to assess iron levels and ferritin. - Continue current birth  control regimen and provided a refill.  Contraceptive management Currently on birth control, which may help manage menstrual symptoms. - Provided refill for birth control.  Immunization Due for flu vaccination. - Administered flu shot.  F/up prn   Subjective:    Chief Complaint  Patient presents with   Acute Visit    Pt states she has been having muscle spams in her leg especially the left leg, she has been taking Naproxen  has been going on a couple of weeks    HPI Discussed the use of AI scribe software for clinical note transcription with the patient, who gave verbal consent to proceed.  History of Present Illness CHARLISA CHAM is a 43 year old female who presents with leg cramps and painful periods.  She experiences cramping in her left leg, described as feeling like a 'Charlie horse', primarily at night but also during the day. The pain sometimes radiates to her buttocks and can occur once or twice an hour. The episodes have been less frequent recently, but there was a period when it was happening daily, impacting her ability to work. Naproxen , taken with a full glass of water, eases the pain.  She has a history of iron deficiency and is concerned it might be contributing to her symptoms. Her dietary habits include drinking water primarily at work, with soda and juice in the evenings. She works long shifts, often 10 to 12 hours, and is on her feet all day, which she believes may contribute to her leg pain. She wears [employer].  She reports having 'horrible' periods, with significant pain during the first two days and the  presence of large clots. Her periods are regular, occurring once a month, but she feels they are more draining than in the past. She is currently on birth control.  Sometimes her arms or legs go numb when she is trying to sleep, even when she is not lying on them awkwardly. She has a high pain tolerance and has managed to continue working despite the  discomfort.     Past Medical History:  Diagnosis Date   Medical history non-contributory     Past Surgical History:  Procedure Laterality Date   DILATION AND CURETTAGE OF UTERUS     NO PAST SURGERIES     WISDOM TOOTH EXTRACTION      Family History  Problem Relation Age of Onset   Diabetes Mother    Hypertension Mother    Diabetes Father    Hypertension Father    Thyroid disease Sister    Hypertension Maternal Uncle    Hypertension Maternal Grandfather    Sleep apnea Neg Hx    Migraines Neg Hx     Social History   Tobacco Use   Smoking status: Never   Smokeless tobacco: Never  Vaping Use   Vaping status: Never Used  Substance Use Topics   Alcohol use: Yes    Comment: occ   Drug use: Yes    Types: Marijuana     No Known Allergies  Review of Systems NEGATIVE UNLESS OTHERWISE INDICATED IN HPI      Objective:     BP 112/66 (BP Location: Left Arm, Patient Position: Sitting, Cuff Size: Large)   Pulse 88   Temp 97.9 F (36.6 C) (Temporal)   Ht 5' 7 (1.702 m)   Wt 205 lb 8 oz (93.2 kg)   LMP 05/26/2024 (Exact Date)   SpO2 99%   BMI 32.19 kg/m   Wt Readings from Last 3 Encounters:  06/15/24 205 lb 8 oz (93.2 kg)  08/10/23 219 lb (99.3 kg)  06/26/23 225 lb 6.4 oz (102.2 kg)    BP Readings from Last 3 Encounters:  06/15/24 112/66  10/01/23 127/87  08/25/23 (!) 145/82     Physical Exam Vitals and nursing note reviewed.  Constitutional:      Appearance: Normal appearance. She is obese.  Eyes:     Extraocular Movements: Extraocular movements intact.     Conjunctiva/sclera: Conjunctivae normal.     Pupils: Pupils are equal, round, and reactive to light.  Cardiovascular:     Rate and Rhythm: Normal rate and regular rhythm.     Pulses: Normal pulses.     Heart sounds: Normal heart sounds. No murmur heard. Pulmonary:     Effort: Pulmonary effort is normal.     Breath sounds: Normal breath sounds.  Musculoskeletal:        General: No swelling  or tenderness.     Right lower leg: No edema.     Left lower leg: No edema.  Neurological:     General: No focal deficit present.     Mental Status: She is alert and oriented to person, place, and time.     Sensory: No sensory deficit.     Motor: No weakness.     Gait: Gait normal.     Comments: Normal strength bilateral lower extremities   Psychiatric:        Mood and Affect: Mood normal.             Jaysha Lasure M Tresea Heine, PA-C

## 2024-06-16 ENCOUNTER — Ambulatory Visit: Payer: Self-pay | Admitting: Physician Assistant

## 2024-06-16 NOTE — Progress Notes (Signed)
 Patient reviewed results and Lori Cross message on MyChart. No called made.   Last read by Olam DELENA Pouch at 12:13PM on 06/16/2024.

## 2024-06-27 ENCOUNTER — Encounter: Payer: Self-pay | Admitting: Physician Assistant

## 2024-06-27 ENCOUNTER — Ambulatory Visit (INDEPENDENT_AMBULATORY_CARE_PROVIDER_SITE_OTHER): Payer: Medicaid Other | Admitting: Physician Assistant

## 2024-06-27 VITALS — BP 136/86 | HR 80 | Temp 97.9°F | Ht 66.54 in | Wt 209.0 lb

## 2024-06-27 DIAGNOSIS — E7800 Pure hypercholesterolemia, unspecified: Secondary | ICD-10-CM

## 2024-06-27 DIAGNOSIS — Z3040 Encounter for surveillance of contraceptives, unspecified: Secondary | ICD-10-CM

## 2024-06-27 DIAGNOSIS — Z0000 Encounter for general adult medical examination without abnormal findings: Secondary | ICD-10-CM

## 2024-06-27 DIAGNOSIS — R748 Abnormal levels of other serum enzymes: Secondary | ICD-10-CM

## 2024-06-27 DIAGNOSIS — Z0001 Encounter for general adult medical examination with abnormal findings: Secondary | ICD-10-CM

## 2024-06-27 DIAGNOSIS — R7989 Other specified abnormal findings of blood chemistry: Secondary | ICD-10-CM

## 2024-06-27 DIAGNOSIS — R252 Cramp and spasm: Secondary | ICD-10-CM

## 2024-06-27 LAB — LIPID PANEL
Cholesterol: 170 mg/dL (ref 0–200)
HDL: 56.9 mg/dL (ref >39.00)
LDL Cholesterol: 98 mg/dL (ref 0–99)
NonHDL: 113.49
Total CHOL/HDL Ratio: 3
Triglycerides: 77 mg/dL (ref 0.0–149.0)
VLDL: 15.4 mg/dL (ref 0.0–40.0)

## 2024-06-27 LAB — CBC WITH DIFFERENTIAL/PLATELET
Basophils Absolute: 0.1 K/uL (ref 0.0–0.1)
Basophils Relative: 0.6 % (ref 0.0–3.0)
Eosinophils Absolute: 0.2 K/uL (ref 0.0–0.7)
Eosinophils Relative: 1.9 % (ref 0.0–5.0)
HCT: 37.4 % (ref 36.0–46.0)
Hemoglobin: 12.7 g/dL (ref 12.0–15.0)
Lymphocytes Relative: 31 % (ref 12.0–46.0)
Lymphs Abs: 3.5 K/uL (ref 0.7–4.0)
MCHC: 34 g/dL (ref 30.0–36.0)
MCV: 91.7 fl (ref 78.0–100.0)
Monocytes Absolute: 0.7 K/uL (ref 0.1–1.0)
Monocytes Relative: 6.7 % (ref 3.0–12.0)
Neutro Abs: 6.7 K/uL (ref 1.4–7.7)
Neutrophils Relative %: 59.8 % (ref 43.0–77.0)
Platelets: 357 K/uL (ref 150.0–400.0)
RBC: 4.08 Mil/uL (ref 3.87–5.11)
RDW: 13.7 % (ref 11.5–15.5)
WBC: 11.2 K/uL — ABNORMAL HIGH (ref 4.0–10.5)

## 2024-06-27 LAB — HEPATIC FUNCTION PANEL
ALT: 33 U/L (ref 0–35)
AST: 23 U/L (ref 0–37)
Albumin: 4.1 g/dL (ref 3.5–5.2)
Alkaline Phosphatase: 65 U/L (ref 39–117)
Bilirubin, Direct: 0 mg/dL (ref 0.0–0.3)
Total Bilirubin: 0.3 mg/dL (ref 0.2–1.2)
Total Protein: 6.8 g/dL (ref 6.0–8.3)

## 2024-06-27 NOTE — Progress Notes (Signed)
 Patient ID: Lori Cross, female    DOB: Dec 17, 1980, 43 y.o.   MRN: [id number]   Assessment & Plan:  Encounter for annual physical exam -     CBC with Differential/Platelet -     Hepatic function panel -     Lipid panel  Elevated LDL cholesterol level -     Lipid panel  Elevated platelet count -     CBC with Differential/Platelet  Elevated liver enzymes -     Hepatic function panel  Encounter for surveillance of contraceptives, unspecified contraceptive    Assessment & Plan Elevated liver function tests Recent labs indicate elevated liver function tests, likely due to alcohol consumption. She has been reducing alcohol intake. - Order hepatic function panel to recheck liver function tests - Advise continued reduction in alcohol consumption  Thrombocytosis Slightly elevated platelet count, possibly related to liver function abnormalities. - Recheck platelet count with hepatic function panel  Leg cramps Intermittent leg cramps with improvement. Possible dietary influence. - Encourage dietary modifications if beneficial  Contraceptive management Adherent to daily birth control, satisfied with current method, and due for refill. - Refill birth control prescription   General Health Maintenance Scheduled for first mammogram with potential recall due to lack of prior images. Pap smear and tetanus vaccinations are current. Not sexually active, no STD screening needed. No dermatological issues or menstrual changes. - Schedule mammogram and inform about potential recall - Encourage healthy lifestyle choices, including exercise and proper nutrition - Advise on maintaining hydration with water and electrolytes   Patient Counseling: [x]   Nutrition: Stressed importance of moderation in sodium/caffeine intake, saturated fat and cholesterol, caloric balance, sufficient intake of fresh fruits, vegetables, and fiber.  [x]   Stressed the importance of regular exercise.   [x]    Substance Abuse: Discussed cessation/primary prevention of tobacco, alcohol, or other drug use; driving or other dangerous activities under the influence; availability of treatment for abuse.   []   Injury prevention: Discussed safety belts, safety helmets, smoke detector, smoking near bedding or upholstery.   []   Sexuality: Discussed sexually transmitted diseases, partner selection, use of condoms, avoidance of unintended pregnancy  and contraceptive alternatives.   [x]   Dental health: Discussed importance of regular tooth brushing, flossing, and dental visits.  [x]   Health maintenance and immunizations reviewed. Please refer to Health maintenance section.       Return in about 1 year (around 06/27/2025) for physical.    Subjective:    Chief Complaint  Patient presents with   Annual Exam    Pt in office for annual CPE; labs previously collected;     HPI Discussed the use of AI scribe software for clinical note transcription with the patient, who gave verbal consent to proceed.  History of Present Illness Lori Cross is a 43 year old female who presents for an annual physical exam and re-evaluation of elevated liver function tests.  She has a history of elevated liver function tests, which were noted during her last visit. She recalls consuming alcohol prior to the last test but has since reduced her intake.  She experiences occasional leg cramps, which have been improving. The cramps are localized and less severe than before. She has not made significant changes to her routine but mentions a slight dietary change due to nervousness.  She takes birth control daily and recently refilled her prescription. She is not sexually active and has no concerns about sexually transmitted diseases. Her menstrual cycle is regular, occurring monthly.  She has reduced her alcohol consumption, encouraged by a friend. She does not smoke or vape but occasionally uses another substance. She is  working on improving her diet and maintaining hydration with water and electrolytes.  She has no dermatological issues, although she notices some moles, which she attributes to her family history, as her mother has many moles.     Past Medical History:  Diagnosis Date   Medical history non-contributory     Past Surgical History:  Procedure Laterality Date   DILATION AND CURETTAGE OF UTERUS     NO PAST SURGERIES     WISDOM TOOTH EXTRACTION      Family History  Problem Relation Age of Onset   Diabetes Mother    Hypertension Mother    Diabetes Father    Hypertension Father    Thyroid disease Sister    Hypertension Maternal Uncle    Hypertension Maternal Grandfather    Sleep apnea Neg Hx    Migraines Neg Hx     Social History   Tobacco Use   Smoking status: Never   Smokeless tobacco: Never  Vaping Use   Vaping status: Never Used  Substance Use Topics   Alcohol use: Yes    Comment: occ   Drug use: Yes    Types: Marijuana     No Known Allergies  Review of Systems NEGATIVE UNLESS OTHERWISE INDICATED IN HPI      Objective:     BP 136/86 (BP Location: Left Arm, Patient Position: Sitting, Cuff Size: Normal)   Pulse 80   Temp 97.9 F (36.6 C) (Temporal)   Ht 5' 6.54 (1.69 m)   Wt 209 lb (94.8 kg)   LMP 06/20/2024 (Exact Date)   SpO2 99%   BMI 33.19 kg/m   Wt Readings from Last 3 Encounters:  06/27/24 209 lb (94.8 kg)  06/15/24 205 lb 8 oz (93.2 kg)  08/10/23 219 lb (99.3 kg)    BP Readings from Last 3 Encounters:  06/27/24 136/86  06/15/24 112/66  10/01/23 127/87     Physical Exam Vitals and nursing note reviewed.  Constitutional:      Appearance: Normal appearance. She is obese. She is not toxic-appearing.  HENT:     Head: Normocephalic and atraumatic.     Right Ear: Tympanic membrane, ear canal and external ear normal.     Left Ear: Tympanic membrane, ear canal and external ear normal.     Nose: Nose normal.     Mouth/Throat:     Mouth:  Mucous membranes are moist.  Eyes:     Extraocular Movements: Extraocular movements intact.     Conjunctiva/sclera: Conjunctivae normal.     Pupils: Pupils are equal, round, and reactive to light.  Cardiovascular:     Rate and Rhythm: Normal rate and regular rhythm.     Pulses: Normal pulses.     Heart sounds: Normal heart sounds.  Pulmonary:     Effort: Pulmonary effort is normal.     Breath sounds: Normal breath sounds.  Abdominal:     General: Abdomen is flat. Bowel sounds are normal.     Palpations: Abdomen is soft.  Musculoskeletal:        General: Normal range of motion.     Cervical back: Normal range of motion and neck supple.  Skin:    General: Skin is warm and dry.  Neurological:     General: No focal deficit present.     Mental Status: She is alert and oriented  to person, place, and time.  Psychiatric:        Mood and Affect: Mood normal.        Behavior: Behavior normal.        Thought Content: Thought content normal.        Judgment: Judgment normal.             Perley Arthurs M Siennah Barrasso, PA-C

## 2024-06-27 NOTE — Patient Instructions (Signed)
 Please call the [facility] to schedule your mammogram. [phone]  Repeat labs today  Keep working on health goals

## 2024-06-28 ENCOUNTER — Ambulatory Visit: Payer: Self-pay | Admitting: Physician Assistant

## 2024-07-05 ENCOUNTER — Telehealth: Payer: Self-pay | Admitting: Emergency Medicine

## 2024-07-05 DIAGNOSIS — R3989 Other symptoms and signs involving the genitourinary system: Secondary | ICD-10-CM

## 2024-07-05 NOTE — Progress Notes (Signed)
  I am very limited in what I can treat by evisit. Because this is your 5th UTI in less than a year, I feel your condition warrants further evaluation and I recommend that you be seen in a face-to-face visit. I strongly recommend you be seen at an urgent care so you can get urine testing.    NOTE: There will be NO CHARGE for this E-Visit   If you are having a true medical emergency, please call 911.     For an urgent face to face visit, [facility] has multiple urgent care centers for your convenience.  Click the link below for the full list of locations and hours, walk-in wait times, appointment scheduling options and driving directions:  Urgent Care - [place], [place], [place], [place], [place], [place]  [place]     Your MyChart E-visit questionnaire answers were reviewed by a board certified advanced clinical practitioner to complete your personal care plan based on your specific symptoms.    Thank you for using e-Visits.

## 2024-09-05 ENCOUNTER — Telehealth: Admitting: Physician Assistant

## 2024-09-05 DIAGNOSIS — R3989 Other symptoms and signs involving the genitourinary system: Secondary | ICD-10-CM

## 2024-09-06 MED ORDER — CEPHALEXIN 500 MG PO CAPS: 500.0000 mg | ORAL_CAPSULE | Freq: Two times a day (BID) | ORAL | 0 refills | Status: AC

## 2024-09-06 NOTE — Progress Notes (Signed)

## 2025-06-29 ENCOUNTER — Encounter: Payer: Self-pay | Admitting: Physician Assistant
